# Patient Record
Sex: Male | Born: 1965 | Race: White | Hispanic: No | Marital: Married | Smoking: Never smoker
Health system: Southern US, Community
[De-identification: ages and names within clinical notes are randomized; demographics above are authoritative.]

## PROBLEM LIST (undated history)

## (undated) DIAGNOSIS — M19041 Primary osteoarthritis, right hand: Secondary | ICD-10-CM

## (undated) DIAGNOSIS — M19042 Primary osteoarthritis, left hand: Secondary | ICD-10-CM

## (undated) DIAGNOSIS — M19071 Primary osteoarthritis, right ankle and foot: Secondary | ICD-10-CM

## (undated) DIAGNOSIS — M069 Rheumatoid arthritis, unspecified: Secondary | ICD-10-CM

## (undated) DIAGNOSIS — M19072 Primary osteoarthritis, left ankle and foot: Secondary | ICD-10-CM

## (undated) HISTORY — DX: Primary osteoarthritis, left hand: M19.042

## (undated) HISTORY — DX: Rheumatoid arthritis, unspecified: M06.9

## (undated) HISTORY — DX: Primary osteoarthritis, right ankle and foot: M19.071

## (undated) HISTORY — DX: Primary osteoarthritis, right hand: M19.041

## (undated) HISTORY — DX: Primary osteoarthritis, left ankle and foot: M19.072

---

## 2007-09-22 ENCOUNTER — Encounter: Admission: RE | Admit: 2007-09-22 | Discharge: 2007-09-22 | Payer: Self-pay | Admitting: Rheumatology

## 2009-01-18 IMAGING — CR DG CHEST 2V
2 series · 2 of 2 positions shown · non-contrast
Comparison: none

CLINICAL DATA: Screening for TB.
 CHEST - TWO VIEWS:
 Two views of the chest show the lungs to be clear.  No infiltrate or effusion is seen. The heart is within normal limits in size.  No bony abnormality noted.

[view not recorded (1 of 2)]
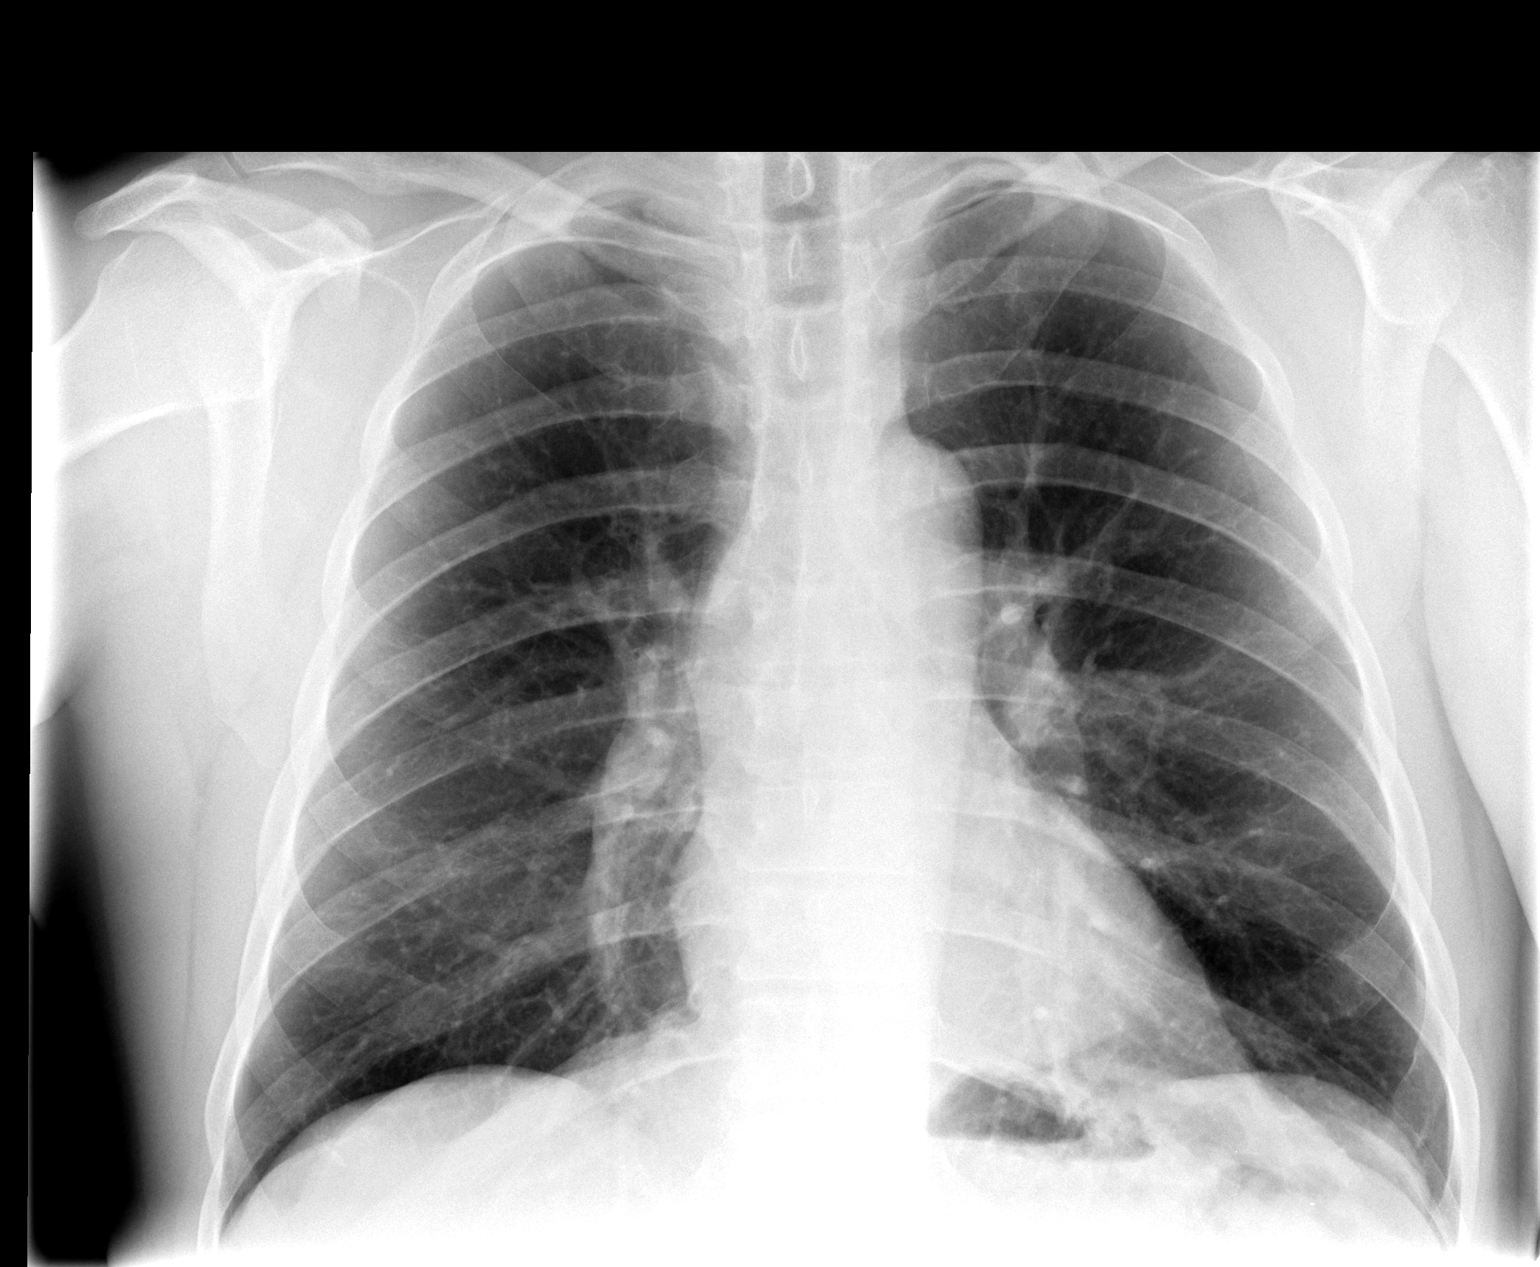

[view not recorded (2 of 2)]
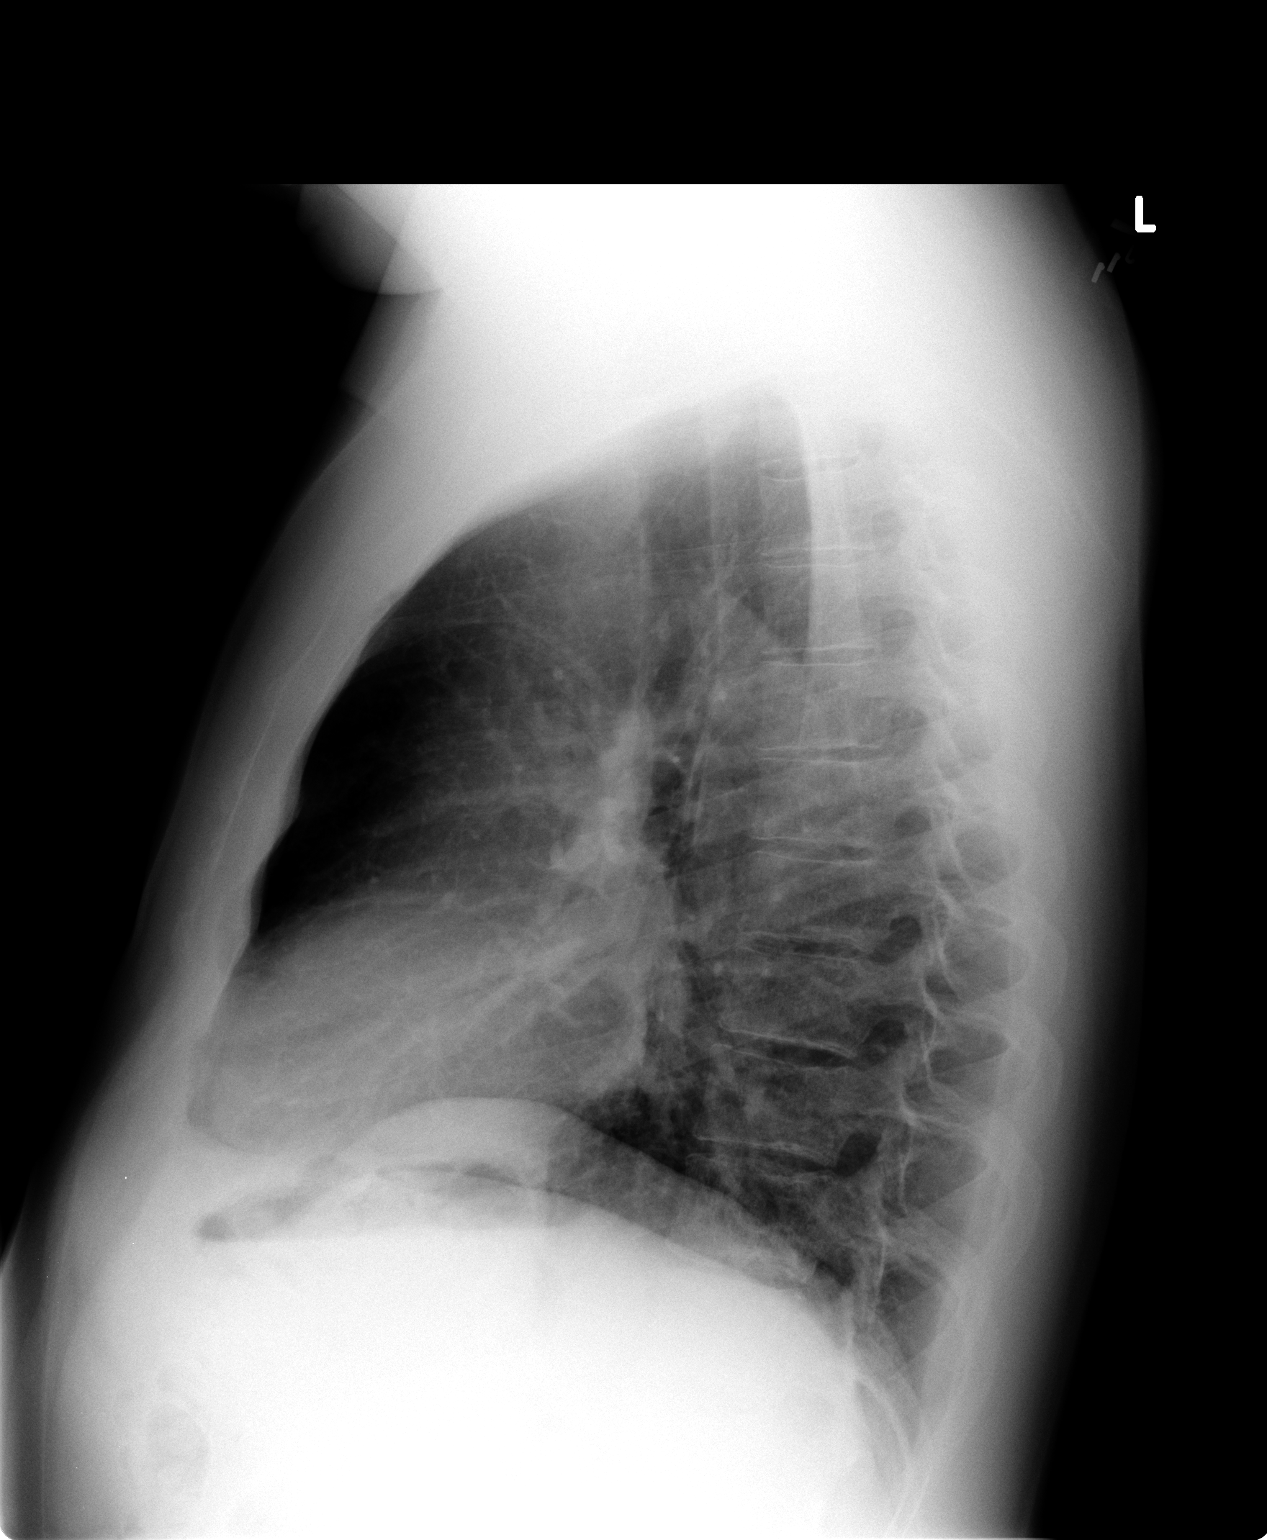

[2 of 2 positions shown; findings below may reference images not displayed]

IMPRESSION: No active lung disease.

## 2016-07-19 LAB — BASIC METABOLIC PANEL
BUN: 16 mg/dL (ref 4–21)
CREATININE: 1 mg/dL (ref 0.6–1.3)
GLUCOSE: 85 mg/dL
POTASSIUM: 4.5 mmol/L (ref 3.4–5.3)
SODIUM: 140 mmol/L (ref 137–147)

## 2016-07-19 LAB — HEPATIC FUNCTION PANEL
ALT: 35 U/L (ref 10–40)
AST: 33 U/L (ref 14–40)
Alkaline Phosphatase: 81 U/L (ref 25–125)
Bilirubin, Total: 1.2 mg/dL

## 2016-07-19 LAB — CBC AND DIFFERENTIAL
HCT: 47 % (ref 41–53)
Hemoglobin: 16.2 g/dL (ref 13.5–17.5)
Platelets: 220 10*3/uL (ref 150–399)
WBC: 5.7 10*3/mL

## 2016-09-03 ENCOUNTER — Other Ambulatory Visit: Payer: Self-pay | Admitting: Rheumatology

## 2016-09-03 NOTE — Telephone Encounter (Signed)
04/21/16 last visit 09/22/16 next visit Labs WNL 07/21/16

## 2016-09-09 ENCOUNTER — Other Ambulatory Visit: Payer: Self-pay | Admitting: Rheumatology

## 2016-09-09 NOTE — Telephone Encounter (Signed)
Last visit 04/21/16 Next visit 09/22/16 Labs 07/21/16 WNL including TB gold  Ok to refill per Dr Corliss Skains

## 2016-09-19 ENCOUNTER — Encounter: Payer: Self-pay | Admitting: *Deleted

## 2016-09-19 DIAGNOSIS — M069 Rheumatoid arthritis, unspecified: Secondary | ICD-10-CM

## 2016-09-19 DIAGNOSIS — M19041 Primary osteoarthritis, right hand: Secondary | ICD-10-CM | POA: Insufficient documentation

## 2016-09-19 DIAGNOSIS — M19071 Primary osteoarthritis, right ankle and foot: Secondary | ICD-10-CM

## 2016-09-19 DIAGNOSIS — M19042 Primary osteoarthritis, left hand: Secondary | ICD-10-CM

## 2016-09-19 DIAGNOSIS — M19072 Primary osteoarthritis, left ankle and foot: Secondary | ICD-10-CM

## 2016-09-19 HISTORY — DX: Primary osteoarthritis, left hand: M19.041

## 2016-09-19 HISTORY — DX: Primary osteoarthritis, left hand: M19.042

## 2016-09-19 HISTORY — DX: Rheumatoid arthritis, unspecified: M06.9

## 2016-09-19 HISTORY — DX: Primary osteoarthritis, right ankle and foot: M19.071

## 2016-09-19 NOTE — Progress Notes (Deleted)
   Office Visit Note  Patient: Ricky Hardin             Date of Birth: 01-22-1966           MRN: [id number]             PCP: No primary care provider on file. Referring: No ref. provider found Visit Date: 09/22/2016 Occupation: @GUAROCC @    Subjective:  No chief complaint on file.   History of Present Illness: Ricky Hardin is a 50 y.o. male ***   Activities of Daily Living:  Patient reports morning stiffness for *** {minute/hour:19697}.   Patient {ACTIONS;DENIES/REPORTS:21021675::"Denies"} nocturnal pain.  Difficulty dressing/grooming: {ACTIONS;DENIES/REPORTS:21021675::"Denies"} Difficulty climbing stairs: {ACTIONS;DENIES/REPORTS:21021675::"Denies"} Difficulty getting out of chair: {ACTIONS;DENIES/REPORTS:21021675::"Denies"} Difficulty using hands for taps, buttons, cutlery, and/or writing: {ACTIONS;DENIES/REPORTS:21021675::"Denies"}   No Rheumatology ROS completed.   PMFS History:  Patient Active Problem List   Diagnosis Date Noted  . RA (rheumatoid arthritis) (HCC) 09/19/2016  . Osteoarthritis of both hands 09/19/2016  . Osteoarthritis of both feet 09/19/2016    Past Medical History:  Diagnosis Date  . Osteoarthritis of both feet 09/19/2016  . Osteoarthritis of both hands 09/19/2016  . RA (rheumatoid arthritis) (HCC) 09/19/2016   +RF, +CCP Erosive disease    No family history on file. No past surgical history on file. Social History   Social History Narrative  . No narrative on file     Objective: Vital Signs: There were no vitals taken for this visit.   Physical Exam   Musculoskeletal Exam: ***  CDAI Exam: No CDAI exam completed.    Investigation: No additional findings.   Imaging: No results found.  Speciality Comments: No specialty comments available.    Procedures:  No procedures performed Allergies: Patient has no known allergies.   Assessment / Plan: Visit Diagnoses: No diagnosis found.    Orders: No orders of the defined  types were placed in this encounter.  No orders of the defined types were placed in this encounter.   Face-to-face time spent with patient was *** minutes. 50% of time was spent in counseling and coordination of care.  Follow-Up Instructions: No Follow-up on file.   13/08/2016, PA-C

## 2016-09-22 ENCOUNTER — Ambulatory Visit: Payer: Self-pay | Admitting: Rheumatology

## 2016-11-06 ENCOUNTER — Other Ambulatory Visit: Payer: Self-pay | Admitting: Rheumatology

## 2016-11-06 LAB — COMPLETE METABOLIC PANEL WITH GFR
ALBUMIN: 4.4 g/dL (ref 3.6–5.1)
ALK PHOS: 72 U/L (ref 40–115)
ALT: 33 U/L (ref 9–46)
AST: 29 U/L (ref 10–35)
BUN: 13 mg/dL (ref 7–25)
CO2: 27 mmol/L (ref 20–31)
Calcium: 9.4 mg/dL (ref 8.6–10.3)
Chloride: 103 mmol/L (ref 98–110)
Creat: 0.88 mg/dL (ref 0.70–1.33)
GFR, Est African American: >89 mL/min (ref >=60)
GLUCOSE: 85 mg/dL (ref 65–99)
POTASSIUM: 4.1 mmol/L (ref 3.5–5.3)
SODIUM: 138 mmol/L (ref 135–146)
Total Bilirubin: 0.9 mg/dL (ref 0.2–1.2)
Total Protein: 7.4 g/dL (ref 6.1–8.1)

## 2016-11-06 LAB — CBC WITH DIFFERENTIAL/PLATELET
BASOS ABS: 0 {cells}/uL (ref 0–200)
Basophils Relative: 0 %
EOS PCT: 2 %
Eosinophils Absolute: 142 cells/uL (ref 15–500)
HCT: 46.7 % (ref 38.5–50.0)
HEMOGLOBIN: 15.7 g/dL (ref 13.2–17.1)
LYMPHS ABS: 1988 {cells}/uL (ref 850–3900)
Lymphocytes Relative: 28 %
MCH: 30.5 pg (ref 27.0–33.0)
MCHC: 33.6 g/dL (ref 32.0–36.0)
MCV: 90.9 fL (ref 80.0–100.0)
MPV: 11.9 fL (ref 7.5–12.5)
Monocytes Absolute: 710 cells/uL (ref 200–950)
Monocytes Relative: 10 %
NEUTROS ABS: 4260 {cells}/uL (ref 1500–7800)
Neutrophils Relative %: 60 %
Platelets: 237 10*3/uL (ref 140–400)
RBC: 5.14 MIL/uL (ref 4.20–5.80)
RDW: 13.7 % (ref 11.0–15.0)
WBC: 7.1 10*3/uL (ref 3.8–10.8)

## 2016-11-06 NOTE — Telephone Encounter (Signed)
Last Visit: 04/21/16 Next Visit: 12/03/16 Labs: 07/21/16 WNL Patient is going to go tomorrow to update labs.   Okay to refill 30 supply of MTX?

## 2016-11-09 NOTE — Progress Notes (Signed)
normal

## 2016-12-03 ENCOUNTER — Ambulatory Visit (INDEPENDENT_AMBULATORY_CARE_PROVIDER_SITE_OTHER): Payer: Commercial Managed Care - PPO | Admitting: Rheumatology

## 2016-12-03 ENCOUNTER — Encounter: Payer: Self-pay | Admitting: Rheumatology

## 2016-12-03 VITALS — BP 136/80 | HR 82 | Resp 14 | Ht 70.0 in | Wt 197.0 lb

## 2016-12-03 DIAGNOSIS — M19041 Primary osteoarthritis, right hand: Secondary | ICD-10-CM | POA: Diagnosis not present

## 2016-12-03 DIAGNOSIS — Z79899 Other long term (current) drug therapy: Secondary | ICD-10-CM

## 2016-12-03 DIAGNOSIS — M19072 Primary osteoarthritis, left ankle and foot: Secondary | ICD-10-CM

## 2016-12-03 DIAGNOSIS — M0579 Rheumatoid arthritis with rheumatoid factor of multiple sites without organ or systems involvement: Secondary | ICD-10-CM

## 2016-12-03 DIAGNOSIS — M5442 Lumbago with sciatica, left side: Secondary | ICD-10-CM | POA: Diagnosis not present

## 2016-12-03 DIAGNOSIS — M19071 Primary osteoarthritis, right ankle and foot: Secondary | ICD-10-CM

## 2016-12-03 DIAGNOSIS — G8929 Other chronic pain: Secondary | ICD-10-CM

## 2016-12-03 DIAGNOSIS — M19042 Primary osteoarthritis, left hand: Secondary | ICD-10-CM

## 2016-12-03 MED ORDER — METHOTREXATE SODIUM CHEMO INJECTION 50 MG/2ML: INTRAMUSCULAR | 0 refills | Status: DC

## 2016-12-03 MED ORDER — FOLIC ACID 1 MG PO TABS: 2.0000 mg | ORAL_TABLET | Freq: Every day | ORAL | 4 refills | Status: DC

## 2016-12-03 MED ORDER — ADALIMUMAB 40 MG/0.8ML ~~LOC~~ AJKT: 1.0000 "pen " | AUTO-INJECTOR | SUBCUTANEOUS | 2 refills | Status: DC

## 2016-12-03 NOTE — Progress Notes (Signed)
Office Visit Note  Patient: Ricky Hardin             Date of Birth: [birth year]           MRN: [id number]             PCP: No PCP Per Patient Referring: No ref. provider found Visit Date: 12/03/2016 Occupation: '@GUAROCC' @    Subjective:  Follow-up Rheumatoid arthritis, [name], methotrexate, folic acid, low back pain  History of Present Illness: Ricky Hardin is a 51 y.o. male  Last seen 04/21/2016. Patient is doing well with RA.  Taking medication as prescribed.  Low back pain improved on its own.  Labs are up-to-date and normal.   Activities of Daily Living:  Patient reports morning stiffness for 15 minutes.   Patient Denies nocturnal pain.  Difficulty dressing/grooming: Denies Difficulty climbing stairs: Denies Difficulty getting out of chair: Denies Difficulty using hands for taps, buttons, cutlery, and/or writing: Denies   Review of Systems  Constitutional: Negative for fatigue.  HENT: Negative for mouth sores and mouth dryness.   Eyes: Negative for dryness.  Respiratory: Negative for shortness of breath.   Gastrointestinal: Negative for constipation and diarrhea.  Musculoskeletal: Negative for myalgias and myalgias.  Skin: Negative for sensitivity to sunlight.  Neurological: Negative for memory loss.  Psychiatric/Behavioral: Negative for sleep disturbance.    PMFS History:  Patient Active Problem List   Diagnosis Date Noted  . RA (rheumatoid arthritis) ([place]) 09/19/2016  . Osteoarthritis of both hands 09/19/2016  . Osteoarthritis of both feet 09/19/2016    Past Medical History:  Diagnosis Date  . Osteoarthritis of both feet 09/19/2016  . Osteoarthritis of both hands 09/19/2016  . RA (rheumatoid arthritis) ([place]) 09/19/2016   +RF, +CCP Erosive disease    No family history on file. No past surgical history on file. Social History   Social History Narrative  . No narrative on file     Objective: Vital Signs: BP 136/80   Pulse 82   Resp 14    Ht '5\' 10"'  (1.778 m)   Wt 197 lb (89.4 kg)   BMI 28.27 kg/m    Physical Exam  Constitutional: He is oriented to person, place, and time. He appears well-developed and well-nourished.  HENT:  Head: Normocephalic and atraumatic.  Eyes: Conjunctivae and EOM are normal. Pupils are equal, round, and reactive to light.  Neck: Normal range of motion. Neck supple.  Cardiovascular: Normal rate, regular rhythm and normal heart sounds.  Exam reveals no gallop and no friction rub.   No murmur heard. Pulmonary/Chest: Effort normal and breath sounds normal. No respiratory distress. He has no wheezes. He has no rales. He exhibits no tenderness.  Abdominal: Soft. He exhibits no distension and no mass. There is no tenderness. There is no guarding.  Musculoskeletal: Normal range of motion.  Lymphadenopathy:    He has no cervical adenopathy.  Neurological: He is alert and oriented to person, place, and time. He exhibits normal muscle tone. Coordination normal.  Skin: Skin is warm and dry. Capillary refill takes less than 2 seconds. No rash noted.  Psychiatric: He has a normal mood and affect. His behavior is normal. Judgment and thought content normal.  Nursing note and vitals reviewed.    Musculoskeletal Exam:  Full range of motion of all joints Grip strength is equal and strong bilaterally Fiber myalgia tender points are all absent  CDAI Exam: CDAI Homunculus Exam:   Joint Counts:  CDAI Tender Joint count: 0  CDAI Swollen Joint count: 0  Global Assessments:  Patient Global Assessment: 0 Provider Global Assessment: 0  CDAI Calculated Score: 0  No synovitis No joint pain swelling or stiffness History of OA of the hands with occasional very mild pain and discomfort  Investigation: No additional findings.  Orders Only on 11/06/2016  Component Date Value Ref Range Status  . Sodium 11/06/2016 138  135 - 146 mmol/L Final  . Potassium 11/06/2016 4.1  3.5 - 5.3 mmol/L Final  . Chloride  11/06/2016 103  98 - 110 mmol/L Final  . CO2 11/06/2016 27  20 - 31 mmol/L Final  . Glucose, Bld 11/06/2016 85  65 - 99 mg/dL Final  . BUN 11/06/2016 13  7 - 25 mg/dL Final  . Creat 11/06/2016 0.88  0.70 - 1.33 mg/dL Final   Comment:   For patients > or = 52 years of age: The upper reference limit for Creatinine is approximately 13% higher for people identified as African-American.     . Total Bilirubin 11/06/2016 0.9  0.2 - 1.2 mg/dL Final  . Alkaline Phosphatase 11/06/2016 72  40 - 115 U/L Final  . AST 11/06/2016 29  10 - 35 U/L Final  . ALT 11/06/2016 33  9 - 46 U/L Final  . Total Protein 11/06/2016 7.4  6.1 - 8.1 g/dL Final  . Albumin 11/06/2016 4.4  3.6 - 5.1 g/dL Final  . Calcium 11/06/2016 9.4  8.6 - 10.3 mg/dL Final  . GFR, Est African American 11/06/2016 >89  >=60 mL/min Final  . GFR, Est Non African American 11/06/2016 >89  >=60 mL/min Final  . WBC 11/06/2016 7.1  3.8 - 10.8 K/uL Final  . RBC 11/06/2016 5.14  4.20 - 5.80 MIL/uL Final  . Hemoglobin 11/06/2016 15.7  13.2 - 17.1 g/dL Final  . HCT 11/06/2016 46.7  38.5 - 50.0 % Final  . MCV 11/06/2016 90.9  80.0 - 100.0 fL Final  . MCH 11/06/2016 30.5  27.0 - 33.0 pg Final  . MCHC 11/06/2016 33.6  32.0 - 36.0 g/dL Final  . RDW 11/06/2016 13.7  11.0 - 15.0 % Final  . Platelets 11/06/2016 237  140 - 400 K/uL Final  . MPV 11/06/2016 11.9  7.5 - 12.5 fL Final  . Neutro Abs 11/06/2016 4260  1,500 - 7,800 cells/uL Final  . Lymphs Abs 11/06/2016 1988  850 - 3,900 cells/uL Final  . Monocytes Absolute 11/06/2016 710  200 - 950 cells/uL Final  . Eosinophils Absolute 11/06/2016 142  15 - 500 cells/uL Final  . Basophils Absolute 11/06/2016 0  0 - 200 cells/uL Final  . Neutrophils Relative % 11/06/2016 60  % Final  . Lymphocytes Relative 11/06/2016 28  % Final  . Monocytes Relative 11/06/2016 10  % Final  . Eosinophils Relative 11/06/2016 2  % Final  . Basophils Relative 11/06/2016 0  % Final  . Smear Review 11/06/2016 Criteria for  review not met   Final  Abstract on 09/19/2016  Component Date Value Ref Range Status  . Hemoglobin 07/19/2016 16.2  13.5 - 17.5 g/dL Final  . HCT 07/19/2016 47  41 - 53 % Final  . Platelets 07/19/2016 220  150 - 399 K/L Final  . WBC 07/19/2016 5.7  10^3/mL Final  . Glucose 07/19/2016 85  mg/dL Final  . BUN 07/19/2016 16  4 - 21 mg/dL Final  . Creatinine 07/19/2016 1.0  0.6 - 1.3 mg/dL Final  . Potassium 07/19/2016 4.5  3.4 - 5.3 mmol/L Final  .  Sodium 07/19/2016 140  137 - 147 mmol/L Final  . Alkaline Phosphatase 07/19/2016 81  25 - 125 U/L Final  . ALT 07/19/2016 35  10 - 40 U/L Final  . AST 07/19/2016 33  14 - 40 U/L Final  . Bilirubin, Total 07/19/2016 1.2  mg/dL Final     Imaging: No results found.  Speciality Comments: No specialty comments available.    Procedures:  No procedures performed Allergies: Patient has no known allergies.   Assessment / Plan:     Visit Diagnoses: Rheumatoid arthritis involving multiple sites with positive rheumatoid factor (HCC)  High risk medications (not anticoagulants) long-term use - 11/02/2017: [name] every 2 weeksAnd methotrexate 0.7 mL's every ThursdayFolic acid 2 mg every day  Primary osteoarthritis of both hands  Chronic midline low back pain with left-sided sciatica  Primary osteoarthritis of both feet    Plan: #1: Doing well with rheumatoid arthritis. No joint pain stiffness and swelling. History of positive rheumatoid factor, positive CCP. History of erosive disease.  #2: High risk prescription. [facility] Every 2 weeks. On Friday (note: Patient states that he starts feeling a little bit of discomfort in his hands a day before he is due for his next [name] shot.) Methotrexate 0.7 mL's every Thursday. Folic acid 2 mg every day.  #3: OA of the hands. Occasional mild discomfort. We discussed OA supplements  #4: History of low back pain. Patient got better. Did not go to physical therapy It went away on its own. No pain  at this time.  Per 5: CBC with differential and CMP with GFR up-to-date and normal. And due every 3 months TB goal will be due July 2018.  #6: Refill [name] every 2 weeks, methotrexate or folic acid  #7: Annual dermatology check  Orders: No orders of the defined types were placed in this encounter.  Meds ordered this encounter  Medications  . Adalimumab ([name] PEN) 40 MG/0.8ML PNKT    Sig: Inject 1 pen into the skin every 14 (fourteen) days.    Dispense:  1 each    Refill:  2  . methotrexate 50 MG/2ML injection    Sig: INJECT 0.7 MILLILITERS ONCE A WEEK    Dispense:  10 mL    Refill:  0  . folic acid (FOLVITE) 1 MG tablet    Sig: Take 2 tablets (2 mg total) by mouth daily.    Dispense:  90 tablet    Refill:  4    Order Specific Question:   Supervising Provider    Answer:   Bo Merino [phone]    Face-to-face time spent with patient was 30 minutes. 50% of time was spent in counseling and coordination of care.  Follow-Up Instructions: Return in about 5 months (around 05/03/2017) for RA,[name],MTX0.74m,folic,lbp w/ left radicul.   NEliezer Lofts PA-C  Note - This record has been created using [employer]  Chart creation errors have been sought, but may not always  have been located. Such creation errors do not reflect on  the standard of medical care.

## 2016-12-03 NOTE — Patient Instructions (Signed)
Supplements for OA Natural anti-inflammatories  You can purchase these at [employer], [employer] or online.  . Turmeric (capsules)  . Ginger (ginger root or capsules)  . Omega 3 (Fish, flax seeds, chia seeds, walnuts, almonds)  . Tart cherry (dried or extract)   Patient should be under the care of a physician while taking these supplements. This may not be reproduced without the permission of Dr. Shaili Deveshwar.  

## 2017-01-22 ENCOUNTER — Other Ambulatory Visit: Payer: Self-pay | Admitting: Rheumatology

## 2017-01-22 NOTE — Telephone Encounter (Signed)
Last Visit: 12/03/16 Next Visit: 05/05/17 Labs: 11/06/16 WNL  Left message for patient to advise he is due to update labs.   Okay to refill MTX?

## 2017-01-23 ENCOUNTER — Telehealth (INDEPENDENT_AMBULATORY_CARE_PROVIDER_SITE_OTHER): Payer: Self-pay

## 2017-01-23 ENCOUNTER — Other Ambulatory Visit: Payer: Self-pay | Admitting: *Deleted

## 2017-01-23 MED ORDER — METHOTREXATE SODIUM CHEMO INJECTION 50 MG/2ML: INTRAMUSCULAR | 0 refills | Status: DC

## 2017-01-23 NOTE — Telephone Encounter (Signed)
[facility] calling stating they can't get the prescribed dose of the methotrexate, only "preservative free". Please call them back about this.

## 2017-01-23 NOTE — Telephone Encounter (Addendum)
Prescription sent to [facility] where they have the MTX with preservative in stock. Patient was agreeable and will pick up his prescription . [facility] advised to cancel prescription.

## 2017-02-16 ENCOUNTER — Other Ambulatory Visit: Payer: Self-pay | Admitting: Rheumatology

## 2017-02-17 NOTE — Telephone Encounter (Deleted)
Last Visit: 12/03/16 Next Visit: 05/05/17 Labs: 11/06/16 WNL Left message to advise patient he is due for labs.

## 2017-02-19 ENCOUNTER — Telehealth: Payer: Self-pay | Admitting: Rheumatology

## 2017-02-19 ENCOUNTER — Other Ambulatory Visit: Payer: Self-pay | Admitting: *Deleted

## 2017-02-19 DIAGNOSIS — Z79899 Other long term (current) drug therapy: Secondary | ICD-10-CM

## 2017-02-19 NOTE — Telephone Encounter (Signed)
Patient is requesting lab orders be released for [facility] on [address]. He will be going tomorrow morning for labs.

## 2017-02-19 NOTE — Telephone Encounter (Signed)
Labs released and faxed  

## 2017-02-20 ENCOUNTER — Other Ambulatory Visit: Payer: Self-pay | Admitting: Rheumatology

## 2017-02-20 LAB — CBC WITH DIFFERENTIAL/PLATELET
BASOS ABS: 56 {cells}/uL (ref 0–200)
BASOS PCT: 1 %
EOS PCT: 4 %
Eosinophils Absolute: 224 cells/uL (ref 15–500)
HCT: 46 % (ref 38.5–50.0)
HEMOGLOBIN: 15.2 g/dL (ref 13.2–17.1)
LYMPHS ABS: 1400 {cells}/uL (ref 850–3900)
Lymphocytes Relative: 25 %
MCH: 30.1 pg (ref 27.0–33.0)
MCHC: 33 g/dL (ref 32.0–36.0)
MCV: 91.1 fL (ref 80.0–100.0)
MPV: 11.7 fL (ref 7.5–12.5)
Monocytes Absolute: 504 cells/uL (ref 200–950)
Monocytes Relative: 9 %
NEUTROS ABS: 3416 {cells}/uL (ref 1500–7800)
Neutrophils Relative %: 61 %
PLATELETS: 245 10*3/uL (ref 140–400)
RBC: 5.05 MIL/uL (ref 4.20–5.80)
RDW: 14 % (ref 11.0–15.0)
WBC: 5.6 10*3/uL (ref 3.8–10.8)

## 2017-02-20 LAB — COMPLETE METABOLIC PANEL WITH GFR
ALBUMIN: 4 g/dL (ref 3.6–5.1)
ALK PHOS: 73 U/L (ref 40–115)
ALT: 31 U/L (ref 9–46)
AST: 25 U/L (ref 10–35)
BUN: 17 mg/dL (ref 7–25)
CO2: 24 mmol/L (ref 20–31)
Calcium: 9.5 mg/dL (ref 8.6–10.3)
Chloride: 104 mmol/L (ref 98–110)
Creat: 1.1 mg/dL (ref 0.70–1.33)
GFR, EST NON AFRICAN AMERICAN: 77 mL/min (ref >=60)
GFR, Est African American: 89 mL/min (ref >=60)
GLUCOSE: 85 mg/dL (ref 65–99)
POTASSIUM: 4.1 mmol/L (ref 3.5–5.3)
SODIUM: 141 mmol/L (ref 135–146)
Total Bilirubin: 0.9 mg/dL (ref 0.2–1.2)
Total Protein: 7.1 g/dL (ref 6.1–8.1)

## 2017-02-20 NOTE — Progress Notes (Signed)
WNL

## 2017-02-27 ENCOUNTER — Other Ambulatory Visit: Payer: Self-pay | Admitting: Rheumatology

## 2017-03-10 ENCOUNTER — Other Ambulatory Visit: Payer: Self-pay | Admitting: Radiology

## 2017-03-10 MED ORDER — METHOTREXATE SODIUM CHEMO INJECTION 50 MG/2ML: INTRAMUSCULAR | 0 refills | Status: DC

## 2017-03-10 NOTE — Telephone Encounter (Signed)
12/03/16 last visit  05/05/17 next visit  Labs WNL 02/20/17 Ok to refill per Dr Corliss Skains

## 2017-03-11 ENCOUNTER — Other Ambulatory Visit: Payer: Self-pay | Admitting: Rheumatology

## 2017-03-24 MED FILL — METHOTREXATE 25 MG/ML VIAL: 50 | 28 days supply | Qty: 4 | Fill #0

## 2017-04-01 ENCOUNTER — Other Ambulatory Visit: Payer: Self-pay | Admitting: Rheumatology

## 2017-04-02 NOTE — Telephone Encounter (Signed)
last visit 12/03/16   next visit 05/05/17 Labs WNL 02/20/17 TB Gold: 05/2016 Neg  Okay to refill [name]?

## 2017-05-01 NOTE — Progress Notes (Signed)
Office Visit Note  Patient: Ricky Hardin             Date of Birth: [birth year]           MRN: [id number]             PCP: Patient, No Pcp Per Referring: No ref. provider found Visit Date: 05/05/2017 Occupation: _0 @    Subjective:  Medication management   History of Present Illness: Ricky Hardin is a 51 y.o. male  Who was last seen 12/03/2016 for rheumatoid arthritis with history of positive rheumatoid factor, positive CCP, history of erosive disease. He states he is doing quite well on combination therapy of methotrexate and [name]. He denies any joint swelling or discomfort recently. His been tolerating his medications well.     Activities of Daily Living:  Patient reports morning stiffness for 0 minute.   Patient Denies nocturnal pain.  Difficulty dressing/grooming: Denies Difficulty climbing stairs: Denies Difficulty getting out of chair: Denies Difficulty using hands for taps, buttons, cutlery, and/or writing: Denies   Review of Systems  Constitutional: Negative for fatigue, night sweats and weakness ( ).  HENT: Negative for mouth sores, mouth dryness and nose dryness.   Eyes: Positive for dryness. Negative for redness.  Respiratory: Negative for shortness of breath and difficulty breathing.   Cardiovascular: Negative for chest pain, palpitations, hypertension, irregular heartbeat and swelling in legs/feet.  Gastrointestinal: Negative for constipation and diarrhea.  Endocrine: Negative for increased urination.  Musculoskeletal: Negative for arthralgias, joint pain, joint swelling, myalgias, muscle weakness, morning stiffness, muscle tenderness and myalgias.  Skin: Negative for color change, rash, hair loss, nodules/bumps, skin tightness, ulcers and sensitivity to sunlight.  Allergic/Immunologic: Negative for susceptible to infections.  Neurological: Negative for dizziness, fainting, memory loss and night sweats.  Hematological: Negative for swollen glands.    Psychiatric/Behavioral: Negative for depressed mood and sleep disturbance. The patient is not nervous/anxious.     PMFS History:  Patient Active Problem List   Diagnosis Date Noted  . RA (rheumatoid arthritis) ([place]) 09/19/2016  . Osteoarthritis of both hands 09/19/2016  . Osteoarthritis of both feet 09/19/2016    Past Medical History:  Diagnosis Date  . Osteoarthritis of both feet 09/19/2016  . Osteoarthritis of both hands 09/19/2016  . RA (rheumatoid arthritis) ([place]) 09/19/2016   +RF, +CCP Erosive disease    History reviewed. No pertinent family history. History reviewed. No pertinent surgical history. Social History   Social History Narrative  . No narrative on file     Objective: Vital Signs: BP 116/78   Pulse (!) 58   Resp 14   Ht _1  (1.778 m)   Wt 195 lb (88.5 kg)   BMI 27.98 kg/m    Physical Exam  Constitutional: He is oriented to person, place, and time. He appears well-developed and well-nourished.  HENT:  Head: Normocephalic and atraumatic.  Eyes: Conjunctivae and EOM are normal. Pupils are equal, round, and reactive to light.  Neck: Normal range of motion. Neck supple.  Cardiovascular: Normal rate, regular rhythm and normal heart sounds.  Exam reveals no gallop and no friction rub.   No murmur heard. Pulmonary/Chest: Effort normal and breath sounds normal. No respiratory distress. He has no wheezes. He has no rales. He exhibits no tenderness.  Abdominal: Soft. Bowel sounds are normal. He exhibits no distension and no mass. There is no tenderness. There is no guarding.  Musculoskeletal: Normal range of motion.  Lymphadenopathy:    He has no cervical  adenopathy.  Neurological: He is alert and oriented to person, place, and time. He exhibits normal muscle tone. Coordination normal.  Skin: Skin is warm and dry. Capillary refill takes less than 2 seconds. No rash noted.  Psychiatric: He has a normal mood and affect. His behavior is normal. Judgment and  thought content normal.  Nursing note and vitals reviewed.    Musculoskeletal Exam: C-spine and thoracic lumbar spine good range of motion. Shoulder joints elbow joints wrist joints are good range of motion. He has some DIP PIP thickening but no synovitis was noted over her MCP joints. Hip joints knee joints ankles MTPs PIPs DIPs with good range of motion with no synovitis.  CDAI Exam: CDAI Homunculus Exam:   Joint Counts:  CDAI Tender Joint count: 0 CDAI Swollen Joint count: 0  Global Assessments:  Patient Global Assessment: 1 Provider Global Assessment: 1  CDAI Calculated Score: 2    Investigation: No additional findings. Orders Only on 02/20/2017  Component Date Value Ref Range Status  . Sodium 02/20/2017 141  135 - 146 mmol/L Final  . Potassium 02/20/2017 4.1  3.5 - 5.3 mmol/L Final  . Chloride 02/20/2017 104  98 - 110 mmol/L Final  . CO2 02/20/2017 24  20 - 31 mmol/L Final  . Glucose, Bld 02/20/2017 85  65 - 99 mg/dL Final  . BUN 02/20/2017 17  7 - 25 mg/dL Final  . Creat 02/20/2017 1.10  0.70 - 1.33 mg/dL Final   Comment:   For patients > or = 52 years of age: The upper reference limit for Creatinine is approximately 13% higher for people identified as African-American.     . Total Bilirubin 02/20/2017 0.9  0.2 - 1.2 mg/dL Final  . Alkaline Phosphatase 02/20/2017 73  40 - 115 U/L Final  . AST 02/20/2017 25  10 - 35 U/L Final  . ALT 02/20/2017 31  9 - 46 U/L Final  . Total Protein 02/20/2017 7.1  6.1 - 8.1 g/dL Final  . Albumin 02/20/2017 4.0  3.6 - 5.1 g/dL Final  . Calcium 02/20/2017 9.5  8.6 - 10.3 mg/dL Final  . GFR, Est African American 02/20/2017 89  >=60 mL/min Final  . GFR, Est Non African American 02/20/2017 77  >=60 mL/min Final  . WBC 02/20/2017 5.6  3.8 - 10.8 K/uL Final  . RBC 02/20/2017 5.05  4.20 - 5.80 MIL/uL Final  . Hemoglobin 02/20/2017 15.2  13.2 - 17.1 g/dL Final  . HCT 02/20/2017 46.0  38.5 - 50.0 % Final  . MCV 02/20/2017 91.1  80.0 -  100.0 fL Final  . MCH 02/20/2017 30.1  27.0 - 33.0 pg Final  . MCHC 02/20/2017 33.0  32.0 - 36.0 g/dL Final  . RDW 02/20/2017 14.0  11.0 - 15.0 % Final  . Platelets 02/20/2017 245  140 - 400 K/uL Final  . MPV 02/20/2017 11.7  7.5 - 12.5 fL Final  . Neutro Abs 02/20/2017 3416  1,500 - 7,800 cells/uL Final  . Lymphs Abs 02/20/2017 1400  850 - 3,900 cells/uL Final  . Monocytes Absolute 02/20/2017 504  200 - 950 cells/uL Final  . Eosinophils Absolute 02/20/2017 224  15 - 500 cells/uL Final  . Basophils Absolute 02/20/2017 56  0 - 200 cells/uL Final  . Neutrophils Relative % 02/20/2017 61  % Final  . Lymphocytes Relative 02/20/2017 25  % Final  . Monocytes Relative 02/20/2017 9  % Final  . Eosinophils Relative 02/20/2017 4  % Final  . Basophils  Relative 02/20/2017 1  % Final  . Smear Review 02/20/2017 Criteria for review not met   Final  Orders Only on 11/06/2016  Component Date Value Ref Range Status  . Sodium 11/06/2016 138  135 - 146 mmol/L Final  . Potassium 11/06/2016 4.1  3.5 - 5.3 mmol/L Final  . Chloride 11/06/2016 103  98 - 110 mmol/L Final  . CO2 11/06/2016 27  20 - 31 mmol/L Final  . Glucose, Bld 11/06/2016 85  65 - 99 mg/dL Final  . BUN 11/06/2016 13  7 - 25 mg/dL Final  . Creat 11/06/2016 0.88  0.70 - 1.33 mg/dL Final   Comment:   For patients > or = 52 years of age: The upper reference limit for Creatinine is approximately 13% higher for people identified as African-American.     . Total Bilirubin 11/06/2016 0.9  0.2 - 1.2 mg/dL Final  . Alkaline Phosphatase 11/06/2016 72  40 - 115 U/L Final  . AST 11/06/2016 29  10 - 35 U/L Final  . ALT 11/06/2016 33  9 - 46 U/L Final  . Total Protein 11/06/2016 7.4  6.1 - 8.1 g/dL Final  . Albumin 11/06/2016 4.4  3.6 - 5.1 g/dL Final  . Calcium 11/06/2016 9.4  8.6 - 10.3 mg/dL Final  . GFR, Est African American 11/06/2016 >89  >=60 mL/min Final  . GFR, Est Non African American 11/06/2016 >89  >=60 mL/min Final  . WBC 11/06/2016 7.1   3.8 - 10.8 K/uL Final  . RBC 11/06/2016 5.14  4.20 - 5.80 MIL/uL Final  . Hemoglobin 11/06/2016 15.7  13.2 - 17.1 g/dL Final  . HCT 11/06/2016 46.7  38.5 - 50.0 % Final  . MCV 11/06/2016 90.9  80.0 - 100.0 fL Final  . MCH 11/06/2016 30.5  27.0 - 33.0 pg Final  . MCHC 11/06/2016 33.6  32.0 - 36.0 g/dL Final  . RDW 11/06/2016 13.7  11.0 - 15.0 % Final  . Platelets 11/06/2016 237  140 - 400 K/uL Final  . MPV 11/06/2016 11.9  7.5 - 12.5 fL Final  . Neutro Abs 11/06/2016 4260  1,500 - 7,800 cells/uL Final  . Lymphs Abs 11/06/2016 1988  850 - 3,900 cells/uL Final  . Monocytes Absolute 11/06/2016 710  200 - 950 cells/uL Final  . Eosinophils Absolute 11/06/2016 142  15 - 500 cells/uL Final  . Basophils Absolute 11/06/2016 0  0 - 200 cells/uL Final  . Neutrophils Relative % 11/06/2016 60  % Final  . Lymphocytes Relative 11/06/2016 28  % Final  . Monocytes Relative 11/06/2016 10  % Final  . Eosinophils Relative 11/06/2016 2  % Final  . Basophils Relative 11/06/2016 0  % Final  . Smear Review 11/06/2016 Criteria for review not met   Final     Imaging: No results found.  Speciality Comments: No specialty comments available.    Procedures:  No procedures performed Allergies: Patient has no known allergies.   Assessment / Plan:     Visit Diagnoses: Rheumatoid arthritis involving multiple sites with positive rheumatoid factor ([place]): Patient has positive rheumatoid factor, positive CCP and history of erosive disease. History really well on current combination of medication. He denies any morning stiffness joint pain or joint swelling.  High risk medications (not anticoagulants) long-term use: He is on methotrexate 0.7 mL subcutaneous every week, folic acid 2 mg by mouth daily, [name] 40 mg subcutaneous every other week. We will check his labs today along with TB gold and then every  3 months to monitor for drug toxicity.  Primary osteoarthritis of both hands: Joint protection and muscle  strengthening discussed.  Primary osteoarthritis of both feet : His been flaring proper fitting shoes.  Family history of heart disease in father and brother: Association of heart disease with rheumatoid arthritis was discussed. Need to monitor blood pressure, cholesterol, and to exercise 30-60 minutes on daily basis was discussed. Poor dental hygiene can be a predisposing factor for rheumatoid arthritis. Good dental hygiene was discussed.  Orders: Orders Placed This Encounter  Procedures  . CBC with Differential/Platelet  . COMPLETE METABOLIC PANEL WITH GFR  . Quantiferon tb gold assay (blood)   No orders of the defined types were placed in this encounter.   Face-to-face time spent with patient was 30 minutes. 50% of time was spent in counseling and coordination of care.  Follow-Up Instructions: Return in about 5 months (around 10/05/2017) for Rheumatoid arthritis.   Bo Merino, MD  Note - This record has been created using Editor, [employer].  Chart creation errors have been sought, but may not always  have been located. Such creation errors do not reflect on  the standard of medical care.

## 2017-05-05 ENCOUNTER — Encounter: Payer: Self-pay | Admitting: Rheumatology

## 2017-05-05 ENCOUNTER — Ambulatory Visit (INDEPENDENT_AMBULATORY_CARE_PROVIDER_SITE_OTHER): Payer: Commercial Managed Care - PPO | Admitting: Rheumatology

## 2017-05-05 VITALS — BP 116/78 | HR 58 | Resp 14 | Ht 70.0 in | Wt 195.0 lb

## 2017-05-05 DIAGNOSIS — Z79899 Other long term (current) drug therapy: Secondary | ICD-10-CM | POA: Diagnosis not present

## 2017-05-05 DIAGNOSIS — M0579 Rheumatoid arthritis with rheumatoid factor of multiple sites without organ or systems involvement: Secondary | ICD-10-CM

## 2017-05-05 DIAGNOSIS — M19071 Primary osteoarthritis, right ankle and foot: Secondary | ICD-10-CM

## 2017-05-05 DIAGNOSIS — M19041 Primary osteoarthritis, right hand: Secondary | ICD-10-CM

## 2017-05-05 DIAGNOSIS — M19072 Primary osteoarthritis, left ankle and foot: Secondary | ICD-10-CM

## 2017-05-05 DIAGNOSIS — M19042 Primary osteoarthritis, left hand: Secondary | ICD-10-CM

## 2017-05-05 LAB — CBC WITH DIFFERENTIAL/PLATELET
BASOS ABS: 77 {cells}/uL (ref 0–200)
Basophils Relative: 1 %
EOS ABS: 231 {cells}/uL (ref 15–500)
EOS PCT: 3 %
HEMATOCRIT: 47.2 % (ref 38.5–50.0)
HEMOGLOBIN: 15.5 g/dL (ref 13.2–17.1)
LYMPHS ABS: 1925 {cells}/uL (ref 850–3900)
Lymphocytes Relative: 25 %
MCH: 29.5 pg (ref 27.0–33.0)
MCHC: 32.8 g/dL (ref 32.0–36.0)
MCV: 89.9 fL (ref 80.0–100.0)
MPV: 12.1 fL (ref 7.5–12.5)
Monocytes Absolute: 693 cells/uL (ref 200–950)
Monocytes Relative: 9 %
NEUTROS PCT: 62 %
Neutro Abs: 4774 cells/uL (ref 1500–7800)
PLATELETS: 246 10*3/uL (ref 140–400)
RBC: 5.25 MIL/uL (ref 4.20–5.80)
RDW: 14 % (ref 11.0–15.0)
WBC: 7.7 10*3/uL (ref 3.8–10.8)

## 2017-05-05 NOTE — Patient Instructions (Signed)
Standing Labs We placed an order today for your standing lab work.    Please come back and get your standing labs in September and every 3 months  We have open lab Monday through Friday from 8:30-11:30 AM and 1:30-4 PM at the office of Dr. Arbutus Ped, PA.   The office is located at [address], [place], [place] [address] No appointment is necessary.   Labs are drawn by [facility].  You may receive a bill from [facility] for your lab work. If you have any questions regarding directions or hours of operation,  please call [phone].    Association of heart disease with rheumatoid arthritis was discussed. Need to monitor blood pressure, cholesterol, and to exercise 30-60 minutes on daily basis was discussed. Poor dental hygiene can be a predisposing factor for rheumatoid arthritis. Good dental hygiene was discussed.

## 2017-05-06 LAB — COMPLETE METABOLIC PANEL WITH GFR
ALT: 27 U/L (ref 9–46)
AST: 25 U/L (ref 10–35)
Albumin: 4.3 g/dL (ref 3.6–5.1)
Alkaline Phosphatase: 83 U/L (ref 40–115)
BUN: 16 mg/dL (ref 7–25)
CHLORIDE: 102 mmol/L (ref 98–110)
CO2: 24 mmol/L (ref 20–31)
CREATININE: 0.84 mg/dL (ref 0.70–1.33)
Calcium: 9.4 mg/dL (ref 8.6–10.3)
GFR, Est African American: >89 mL/min (ref >=60)
GFR, Est Non African American: >89 mL/min (ref >=60)
GLUCOSE: 78 mg/dL (ref 65–99)
Potassium: 4.6 mmol/L (ref 3.5–5.3)
Sodium: 137 mmol/L (ref 135–146)
TOTAL PROTEIN: 7.6 g/dL (ref 6.1–8.1)
Total Bilirubin: 0.9 mg/dL (ref 0.2–1.2)

## 2017-05-07 LAB — QUANTIFERON TB GOLD ASSAY (BLOOD)
Interferon Gamma Release Assay: NEGATIVE
Mitogen-Nil: 8.4 IU/mL
QUANTIFERON NIL VALUE: 0.03 [IU]/mL
Quantiferon Tb Ag Minus Nil Value: 0 IU/mL

## 2017-05-07 NOTE — Progress Notes (Signed)
WNL

## 2017-05-19 MED FILL — METHOTREXATE 25 MG/ML VIAL: 50 | 28 days supply | Qty: 4 | Fill #1

## 2017-07-08 ENCOUNTER — Other Ambulatory Visit: Payer: Self-pay | Admitting: Rheumatology

## 2017-07-08 NOTE — Telephone Encounter (Signed)
Last Visit: 05/05/17 Next Visit: 10/08/17 Labs: 05/05/17 WNL  Okay to refill per Dr. Corliss Skains

## 2017-07-30 ENCOUNTER — Other Ambulatory Visit: Payer: Self-pay | Admitting: Rheumatology

## 2017-07-30 NOTE — Telephone Encounter (Signed)
Last Visit: 05/05/17 Next Visit: 10/08/17 Labs: 05/05/17 WNL TB Gold: 05/05/17 Neg  Okay to refill per Dr. Corliss Skains

## 2017-09-29 NOTE — Progress Notes (Signed)
Office Visit Note  Patient: Ricky Hardin             Date of Birth: 01/04/1966           MRN: [id number]             PCP: Patient, No Pcp Per Referring: No ref. provider found Visit Date: 10/08/2017 Occupation: @GUAROCC @    Subjective:  Medication management    History of Present Illness: Ricky Hardin is a 51 y.o. male with a history of seropositive rheumatoid arthritis and osteoarthritis.  Patient states he has had no recent flares.  He feels very well controlled on [name] and Methotrexate.  He denies any joint pain or swelling.  He states has very mild morning stiffness for a couple minutes first thing in the morning, but this has improved significantly.  He reports good grip strength.      Activities of Daily Living:  Patient reports morning stiffness for2  minutes.   Patient Denies nocturnal pain.  Difficulty dressing/grooming: Denies Difficulty climbing stairs: Denies Difficulty getting out of chair: Denies Difficulty using hands for taps, buttons, cutlery, and/or writing: Denies   Review of Systems  Constitutional: Negative for fatigue.  HENT: Negative for mouth sores, mouth dryness and nose dryness.   Eyes: Positive for dryness. Negative for redness.  Respiratory: Negative for cough, shortness of breath and difficulty breathing.   Cardiovascular: Negative for chest pain, palpitations, hypertension, irregular heartbeat and swelling in legs/feet.  Gastrointestinal: Negative for blood in stool, constipation and diarrhea.  Endocrine: Negative for increased urination.  Genitourinary: Negative for painful urination.  Musculoskeletal: Negative for arthralgias, joint pain, joint swelling, myalgias, muscle weakness, morning stiffness and myalgias.  Skin: Negative for color change, rash, nodules/bumps, redness and skin tightness.  Neurological: Positive for headaches.  Hematological: Negative for swollen glands.  Psychiatric/Behavioral: Negative for depressed mood and sleep  disturbance. The patient is not nervous/anxious.     PMFS History:  Patient Active Problem List   Diagnosis Date Noted  . RA (rheumatoid arthritis) ([facility]) 09/19/2016  . Osteoarthritis of both hands 09/19/2016  . Osteoarthritis of both feet 09/19/2016    Past Medical History:  Diagnosis Date  . Osteoarthritis of both feet 09/19/2016  . Osteoarthritis of both hands 09/19/2016  . RA (rheumatoid arthritis) ([facility]) 09/19/2016   +RF, +CCP Erosive disease    Family History  Problem Relation Age of Onset  . Heart disease Father 72  . Rheum arthritis Sister    History reviewed. No pertinent surgical history. Social History   Social History Narrative  . Not on file     Objective: Vital Signs: BP 116/74 (BP Location: Left Arm, Patient Position: Sitting, Cuff Size: Normal)   Pulse 76   Resp 17   Ht 5\' 10"  (1.778 m)   Wt 194 lb (88 kg)   BMI 27.84 kg/m    Physical Exam  Constitutional: He appears well-developed and well-nourished.  HENT:  Head: Normocephalic.  Eyes: Conjunctivae and EOM are normal.  Neck: Normal range of motion.  Cardiovascular: Normal rate, regular rhythm and normal heart sounds.  Pulmonary/Chest: Effort normal and breath sounds normal.  Abdominal: Soft. Bowel sounds are normal.  Musculoskeletal: Normal range of motion. He exhibits no tenderness.  Lymphadenopathy:    He has no cervical adenopathy.  Neurological: He is alert.  Skin: Skin is warm. Capillary refill takes less than 2 seconds.  Psychiatric: He has a normal mood and affect. His behavior is normal.  Vitals reviewed.  Musculoskeletal Exam: C-spine, thoracic, and lumbar spine good ROM with no discomfort.  Shoulder joints, elbow joints, wrist joints, MCPs, PIPs, and DIPs good ROM with no synovitis.  DIP synovial thickening bilaterally consistent with osteoarthritic changes.  Hip joints, knee joints, and ankle joints good ROM with no synovitis.  MTPs, PIPs, and DIPs good ROM with no synovitis.      CDAI Exam: CDAI Homunculus Exam:   Joint Counts:  CDAI Tender Joint count: 0 CDAI Swollen Joint count: 0  Global Assessments:  Patient Global Assessment: 1 Provider Global Assessment: 1  CDAI Calculated Score: 2    Investigation: No additional findings.Tb Gold: 05/05/2017 Negative  CBC Latest Ref Rng & Units 05/05/2017 02/20/2017 11/06/2016  WBC 3.8 - 10.8 K/uL 7.7 5.6 7.1  Hemoglobin 13.2 - 17.1 g/dL 16.1 09.6 04.5  Hematocrit 38.5 - 50.0 % 47.2 46.0 46.7  Platelets 140 - 400 K/uL 246 245 237   CMP Latest Ref Rng & Units 05/05/2017 02/20/2017 11/06/2016  Glucose 65 - 99 mg/dL 78 85 85  BUN 7 - 25 mg/dL 16 17 13   Creatinine 0.70 - 1.33 mg/dL 4.09 8.11  Sodium 135 - 146 mmol/L 137 141 138  Potassium 3.5 - 5.3 mmol/L 4.6 4.1 4.1  Chloride 98 - 110 mmol/L 102 104 103  CO2 20 - 31 mmol/L 24 24 27   Calcium 8.6 - 10.3 mg/dL 9.4 9.5 9.4  Total Protein 6.1 - 8.1 g/dL 7.6 7.1 7.4  Total Bilirubin 0.2 - 1.2 mg/dL 0.9 0.9 0.9  Alkaline Phos 40 - 115 U/L 83 73 72  AST 10 - 35 U/L 25 25 29   ALT 9 - 46 U/L 27 31 33    Imaging: No results found.  Speciality Comments: No specialty comments available.    Procedures:  No procedures performed Allergies: Patient has no known allergies.   Assessment / Plan:     Visit Diagnoses: Rheumatoid arthritis involving multiple sites with positive rheumatoid factor ([facility]) - Patient has positive rheumatoid factor, positive CCP and history of erosive disease.  Patient is clinically stable with no synovitis on exam.  Patient is doing great with no recent flares on Methotrexate and [name].  Patient was informed that he can start spacing out his [name] injections to every 3 weeks as long as he continues to have no flares.    High risk medication use - methotrexate 0.7 mL subcutaneous every week, folic acid 2 mg by mouth daily, [name] 40 mg subcutaneous every 3 weeks.   Patient will have routine CBC and CMP labs drawn today.  Reminded patient of  the risks and benefits of [name].  Discussed long-term effects and risk of infection.  Encouraged hand-washing and reminded him to hold his dose of [name] if he gets an infection.  He is going to start spacing out his [name] to every 3 weeks.       Primary osteoarthritis of both hands: No pain or swelling of bilateral hands. Long-standing synovial thickening of DIP joints consistent with osteoarthritic changes.    Primary osteoarthritis of both feet: No synovitis or pain at this time.    Family history of heart disease - in father and brother    Orders: Orders Placed This Encounter  Procedures  . CBC with Differential/Platelet  . COMPLETE METABOLIC PANEL WITH GFR  . CBC with Differential/Platelet  . COMPLETE METABOLIC PANEL WITH GFR   No orders of the defined types were placed in this encounter.    Follow-Up Instructions: Return in  about 5 months (around 03/08/2018).    Note - This record has been created using [employer].  Chart creation errors have been sought, but may not always  have been located. Such creation errors do not reflect on  the standard of medical care.

## 2017-10-08 ENCOUNTER — Ambulatory Visit (INDEPENDENT_AMBULATORY_CARE_PROVIDER_SITE_OTHER): Payer: Commercial Managed Care - PPO | Admitting: Rheumatology

## 2017-10-08 ENCOUNTER — Encounter: Payer: Self-pay | Admitting: Rheumatology

## 2017-10-08 VITALS — BP 116/74 | HR 76 | Resp 17 | Ht 70.0 in | Wt 194.0 lb

## 2017-10-08 DIAGNOSIS — M0579 Rheumatoid arthritis with rheumatoid factor of multiple sites without organ or systems involvement: Secondary | ICD-10-CM | POA: Diagnosis not present

## 2017-10-08 DIAGNOSIS — M19042 Primary osteoarthritis, left hand: Secondary | ICD-10-CM

## 2017-10-08 DIAGNOSIS — M19071 Primary osteoarthritis, right ankle and foot: Secondary | ICD-10-CM | POA: Diagnosis not present

## 2017-10-08 DIAGNOSIS — Z79899 Other long term (current) drug therapy: Secondary | ICD-10-CM | POA: Diagnosis not present

## 2017-10-08 DIAGNOSIS — M19041 Primary osteoarthritis, right hand: Secondary | ICD-10-CM

## 2017-10-08 DIAGNOSIS — Z8249 Family history of ischemic heart disease and other diseases of the circulatory system: Secondary | ICD-10-CM

## 2017-10-08 DIAGNOSIS — M19072 Primary osteoarthritis, left ankle and foot: Secondary | ICD-10-CM

## 2017-10-08 LAB — CBC WITH DIFFERENTIAL/PLATELET
BASOS ABS: 63 {cells}/uL (ref 0–200)
Basophils Relative: 0.9 %
EOS PCT: 2.3 %
Eosinophils Absolute: 161 cells/uL (ref 15–500)
HEMATOCRIT: 45 % (ref 38.5–50.0)
HEMOGLOBIN: 15.3 g/dL (ref 13.2–17.1)
LYMPHS ABS: 1820 {cells}/uL (ref 850–3900)
MCH: 30 pg (ref 27.0–33.0)
MCHC: 34 g/dL (ref 32.0–36.0)
MCV: 88.2 fL (ref 80.0–100.0)
MPV: 12.6 fL — ABNORMAL HIGH (ref 7.5–12.5)
Monocytes Relative: 10.7 %
NEUTROS ABS: 4207 {cells}/uL (ref 1500–7800)
NEUTROS PCT: 60.1 %
Platelets: 248 10*3/uL (ref 140–400)
RBC: 5.1 10*6/uL (ref 4.20–5.80)
RDW: 12.8 % (ref 11.0–15.0)
Total Lymphocyte: 26 %
WBC: 7 10*3/uL (ref 3.8–10.8)
WBCMIX: 749 {cells}/uL (ref 200–950)

## 2017-10-08 LAB — COMPLETE METABOLIC PANEL WITH GFR
AG RATIO: 1.4 (calc) (ref 1.0–2.5)
ALBUMIN MSPROF: 4.3 g/dL (ref 3.6–5.1)
ALT: 30 U/L (ref 9–46)
AST: 26 U/L (ref 10–35)
Alkaline phosphatase (APISO): 88 U/L (ref 40–115)
BUN: 16 mg/dL (ref 7–25)
CALCIUM: 9.6 mg/dL (ref 8.6–10.3)
CO2: 27 mmol/L (ref 20–32)
CREATININE: 0.9 mg/dL (ref 0.70–1.33)
Chloride: 104 mmol/L (ref 98–110)
GFR, Est African American: 113 mL/min/{1.73_m2} (ref >=60)
GFR, Est Non African American: 98 mL/min/{1.73_m2} (ref >=60)
GLOBULIN: 3 g/dL (ref 1.9–3.7)
Glucose, Bld: 79 mg/dL (ref 65–99)
Potassium: 4.1 mmol/L (ref 3.5–5.3)
Sodium: 138 mmol/L (ref 135–146)
Total Bilirubin: 0.9 mg/dL (ref 0.2–1.2)
Total Protein: 7.3 g/dL (ref 6.1–8.1)

## 2017-10-08 NOTE — Patient Instructions (Signed)
Standing Labs We placed an order today for your standing lab work.    Please come back and get your standing labs in February 2019 and every 3 months  We have open lab Monday through Friday from 8:30-11:30 AM and 1:30-4 PM at the office of Dr. Pollyann Savoy.   The office is located at [address], [place], [place] [address] No appointment is necessary.   Labs are drawn by [facility].  You may receive a bill from [facility] for your lab work. If you have any questions regarding directions or hours of operation,  please call [phone].

## 2017-10-10 ENCOUNTER — Other Ambulatory Visit: Payer: Self-pay | Admitting: Rheumatology

## 2017-10-12 NOTE — Telephone Encounter (Signed)
Last Visit: 10/08/17 Next Visit: 03/09/18  Okay to refill per Dr. Corliss Skains

## 2017-12-23 ENCOUNTER — Other Ambulatory Visit: Payer: Self-pay | Admitting: Rheumatology

## 2017-12-23 NOTE — Telephone Encounter (Signed)
Last Visit: 10/08/17 Next Visit: 03/09/18 Labs: 10/08/17 WNL TB Gold: 05/05/17 Neg   Okay to refill per Dr. Corliss Skains

## 2018-02-24 NOTE — Progress Notes (Signed)
Office Visit Note  Patient: Ricky Hardin             Date of Birth: Mar 18, 1966           MRN: [id number]             PCP: Patient, No Pcp Per Referring: No ref. provider found Visit Date: 03/09/2018 Occupation: @GUAROCC @    Subjective:  Medication management.   History of Present Illness: REVERE MAAHS is a 52 y.o. male with history of seropositive rheumatoid arthritis and osteoarthritis overlap.  He states he has been doing quite well on current combination of medications.  After his last visit he has increased interval between his [name] injections to every third week.  He states he has been taking methotrexate also every other week.  He does not notice any joint swelling he does have a stiffness and discomfort in joints off and on.  Activities of Daily Living:  Patient reports morning stiffness for 0 minute.   Patient Denies nocturnal pain.  Difficulty dressing/grooming: Denies Difficulty climbing stairs: Denies Difficulty getting out of chair: Denies Difficulty using hands for taps, buttons, cutlery, and/or writing: Denies   Review of Systems  Constitutional: Negative for fatigue and night sweats.  HENT: Negative for mouth sores, mouth dryness and nose dryness.   Eyes: Negative for redness and dryness.  Respiratory: Negative for shortness of breath and difficulty breathing.   Cardiovascular: Negative for chest pain, palpitations, hypertension, irregular heartbeat and swelling in legs/feet.  Gastrointestinal: Negative for constipation and diarrhea.  Endocrine: Negative for increased urination.  Musculoskeletal: Negative for arthralgias, joint pain, joint swelling, myalgias, muscle weakness, morning stiffness, muscle tenderness and myalgias.  Skin: Negative for color change, rash, hair loss, nodules/bumps, skin tightness, ulcers and sensitivity to sunlight.  Allergic/Immunologic: Negative for susceptible to infections.  Neurological: Negative for dizziness, fainting, memory  loss, night sweats and weakness ( ).  Hematological: Negative for swollen glands.  Psychiatric/Behavioral: Negative for depressed mood and sleep disturbance. The patient is not nervous/anxious.     PMFS History:  Patient Active Problem List   Diagnosis Date Noted  . RA (rheumatoid arthritis) (HCC) 09/19/2016  . Osteoarthritis of both hands 09/19/2016  . Osteoarthritis of both feet 09/19/2016    Past Medical History:  Diagnosis Date  . Osteoarthritis of both feet 09/19/2016  . Osteoarthritis of both hands 09/19/2016  . RA (rheumatoid arthritis) (HCC) 09/19/2016   +RF, +CCP Erosive disease    Family History  Problem Relation Age of Onset  . Heart disease Father 32  . Rheum arthritis Sister    History reviewed. No pertinent surgical history. Social History   Social History Narrative  . Not on file     Objective: Vital Signs: BP 108/68 (BP Location: Left Arm, Patient Position: Sitting, Cuff Size: Normal)   Pulse 69   Resp 14   Ht 5\' 10"  (1.778 m)   Wt 196 lb (88.9 kg)   BMI 28.12 kg/m    Physical Exam  Constitutional: He is oriented to person, place, and time. He appears well-developed and well-nourished.  HENT:  Head: Normocephalic and atraumatic.  Eyes: Pupils are equal, round, and reactive to light. Conjunctivae and EOM are normal.  Neck: Normal range of motion. Neck supple.  Cardiovascular: Normal rate, regular rhythm and normal heart sounds.  Pulmonary/Chest: Effort normal and breath sounds normal.  Abdominal: Soft. Bowel sounds are normal.  Neurological: He is alert and oriented to person, place, and time.  Skin: Skin is  warm and dry. Capillary refill takes less than 2 seconds.  Psychiatric: He has a normal mood and affect. His behavior is normal.  Nursing note and vitals reviewed.    Musculoskeletal Exam: C-spine thoracic lumbar spine good range of motion.  Shoulder joints, elbow joints, wrist joints, MCPs PIPs and DIPs were in good range of motion with no  synovitis.  Hip joints knee joints ankles MTPs PIPs were in good range of motion with no synovitis.  CDAI Exam: CDAI Homunculus Exam:   Joint Counts:  CDAI Tender Joint count: 0 CDAI Swollen Joint count: 0  Global Assessments:  Patient Global Assessment: 1 Provider Global Assessment: 1  CDAI Calculated Score: 2    Investigation: No additional findings.TB Gold: 05/05/2017 Negative  CBC Latest Ref Rng & Units 10/08/2017 05/05/2017 02/20/2017  WBC 3.8 - 10.8 Thousand/uL 7.0 7.7 5.6  Hemoglobin 13.2 - 17.1 g/dL 62.8 36.6 29.4  Hematocrit 38.5 - 50.0 % 45.0 47.2 46.0  Platelets 140 - 400 Thousand/uL 248 246 245   CMP Latest Ref Rng & Units 10/08/2017 05/05/2017 02/20/2017  Glucose 65 - 99 mg/dL 79 78 85  BUN 7 - 25 mg/dL 16 16 17   Creatinine 0.70 - 1.33 mg/dL 7.65 4.65  Sodium 135 - 146 mmol/L 138 137 141  Potassium 3.5 - 5.3 mmol/L 4.1 4.6 4.1  Chloride 98 - 110 mmol/L 104 102 104  CO2 20 - 32 mmol/L 27 24 24   Calcium 8.6 - 10.3 mg/dL 9.6 9.4 9.5  Total Protein 6.1 - 8.1 g/dL 7.3 7.6 7.1  Total Bilirubin 0.2 - 1.2 mg/dL 0.9 0.9 0.9  Alkaline Phos 40 - 115 U/L - 83 73  AST 10 - 35 U/L 26 25 25   ALT 9 - 46 U/L 30 27 31    Imaging: No results found.  Speciality Comments: No specialty comments available.    Procedures:  No procedures performed Allergies: Patient has no known allergies.   Assessment / Plan:     Visit Diagnoses: Rheumatoid arthritis involving multiple sites with positive rheumatoid factor (HCC) - Patient has positive rheumatoid factor, positive CCP and history of erosive disease.  He is clinically doing well with no synovitis on examination.  He has spaced his [name] to every 3 weeks.  He has been taking methotrexate every other week.  I have advised him to resume methotrexate to every week andHumira to every fourth week.  If he has a flare he supposed to notify 0.35.  High risk medication use - methotrexate 0.7 mL subcutaneous every week, folic acid 2 mg by  mouth daily, [name] 40 mg subcutaneous every 3 weeks - Plan: CBC with Differential/Platelet, COMPLETE METABOLIC PANEL WITH GFR, QuantiFERON-TB Gold Plus today and then CBC and CMP will be checked every 3 months.  Primary osteoarthritis of both hands-he has some stiffness of the hands but not much discomfort.  Primary osteoarthritis of both feet  Family history of heart disease - in father and brother     Orders: Orders Placed This Encounter  Procedures  . CBC with Differential/Platelet  . COMPLETE METABOLIC PANEL WITH GFR  . QuantiFERON-TB Gold Plus   No orders of the defined types were placed in this encounter.   Face-to-face time spent with patient was 20 minutes. >50% of time was spent in counseling and coordination of care.  Follow-Up Instructions: Return in about 5 months (around 08/09/2018) for Rheumatoid arthritis.   , MD  Note - This record has been created using .  Chart creation errors have been sought, but may not always  have been located. Such creation errors do not reflect on  the standard of medical care.

## 2018-03-09 ENCOUNTER — Ambulatory Visit (INDEPENDENT_AMBULATORY_CARE_PROVIDER_SITE_OTHER): Payer: Commercial Managed Care - PPO | Admitting: Rheumatology

## 2018-03-09 ENCOUNTER — Encounter: Payer: Self-pay | Admitting: Rheumatology

## 2018-03-09 VITALS — BP 108/68 | HR 69 | Resp 14 | Ht 70.0 in | Wt 196.0 lb

## 2018-03-09 DIAGNOSIS — Z79899 Other long term (current) drug therapy: Secondary | ICD-10-CM | POA: Diagnosis not present

## 2018-03-09 DIAGNOSIS — M0579 Rheumatoid arthritis with rheumatoid factor of multiple sites without organ or systems involvement: Secondary | ICD-10-CM

## 2018-03-09 DIAGNOSIS — Z8249 Family history of ischemic heart disease and other diseases of the circulatory system: Secondary | ICD-10-CM | POA: Diagnosis not present

## 2018-03-09 DIAGNOSIS — M19071 Primary osteoarthritis, right ankle and foot: Secondary | ICD-10-CM | POA: Diagnosis not present

## 2018-03-09 DIAGNOSIS — M19072 Primary osteoarthritis, left ankle and foot: Secondary | ICD-10-CM | POA: Diagnosis not present

## 2018-03-09 DIAGNOSIS — M19041 Primary osteoarthritis, right hand: Secondary | ICD-10-CM | POA: Diagnosis not present

## 2018-03-09 DIAGNOSIS — M19042 Primary osteoarthritis, left hand: Secondary | ICD-10-CM

## 2018-03-09 LAB — COMPLETE METABOLIC PANEL WITH GFR
AG RATIO: 1.4 (calc) (ref 1.0–2.5)
ALT: 23 U/L (ref 9–46)
AST: 23 U/L (ref 10–35)
Albumin: 4.3 g/dL (ref 3.6–5.1)
Alkaline phosphatase (APISO): 78 U/L (ref 40–115)
BUN: 20 mg/dL (ref 7–25)
CALCIUM: 9.3 mg/dL (ref 8.6–10.3)
CO2: 26 mmol/L (ref 20–32)
Chloride: 105 mmol/L (ref 98–110)
Creat: 0.95 mg/dL (ref 0.70–1.33)
GFR, EST AFRICAN AMERICAN: 106 mL/min/{1.73_m2} (ref >=60)
GFR, Est Non African American: 92 mL/min/{1.73_m2} (ref >=60)
Globulin: 3 g/dL (calc) (ref 1.9–3.7)
Glucose, Bld: 115 mg/dL — ABNORMAL HIGH (ref 65–99)
POTASSIUM: 4 mmol/L (ref 3.5–5.3)
Sodium: 139 mmol/L (ref 135–146)
TOTAL PROTEIN: 7.3 g/dL (ref 6.1–8.1)
Total Bilirubin: 1 mg/dL (ref 0.2–1.2)

## 2018-03-09 LAB — CBC WITH DIFFERENTIAL/PLATELET
BASOS PCT: 0.7 %
Basophils Absolute: 51 cells/uL (ref 0–200)
EOS PCT: 2.6 %
Eosinophils Absolute: 190 cells/uL (ref 15–500)
HCT: 45.8 % (ref 38.5–50.0)
Hemoglobin: 15.6 g/dL (ref 13.2–17.1)
LYMPHS ABS: 1796 {cells}/uL (ref 850–3900)
MCH: 29.7 pg (ref 27.0–33.0)
MCHC: 34.1 g/dL (ref 32.0–36.0)
MCV: 87.1 fL (ref 80.0–100.0)
MPV: 12.2 fL (ref 7.5–12.5)
Monocytes Relative: 8.7 %
Neutro Abs: 4628 cells/uL (ref 1500–7800)
Neutrophils Relative %: 63.4 %
PLATELETS: 216 10*3/uL (ref 140–400)
RBC: 5.26 10*6/uL (ref 4.20–5.80)
RDW: 12.8 % (ref 11.0–15.0)
TOTAL LYMPHOCYTE: 24.6 %
WBC: 7.3 10*3/uL (ref 3.8–10.8)
WBCMIX: 635 {cells}/uL (ref 200–950)

## 2018-03-09 NOTE — Patient Instructions (Addendum)
Natural anti-inflammatories  You can purchase these at [employer], [employer] or online.  . Turmeric (capsules)  . Ginger (ginger root or capsules)  . Omega 3 (Fish, flax seeds, chia seeds, walnuts, almonds)  . Tart cherry (dried or extract)   Patient should be under the care of a physician while taking these supplements. This may not be reproduced without the permission of Dr. Pollyann Savoy.   Standing Labs We placed an order today for your standing lab work.    Please come back and get your standing labs in August and every 3 months  We have open lab Monday through Friday from 8:30-11:30 AM and 1:30-4:00 PM  at the office of Dr. Pollyann Savoy.   You may experience shorter wait times on Monday and Friday afternoons. The office is located at [address], [address], [place], [place] [address] No appointment is necessary.   Labs are drawn by [facility].  You may receive a bill from [facility] for your lab work. If you have any questions regarding directions or hours of operation,  please call [phone].

## 2018-03-11 LAB — QUANTIFERON-TB GOLD PLUS
Mitogen-NIL: >10 IU/mL
NIL: 0.02 [IU]/mL
QuantiFERON-TB Gold Plus: NEGATIVE
TB1-NIL: 0.01 IU/mL

## 2018-03-23 ENCOUNTER — Other Ambulatory Visit: Payer: Self-pay | Admitting: Rheumatology

## 2018-03-23 NOTE — Telephone Encounter (Signed)
Last visit: 03/09/18 Next visit: 08/10/18 Labs: 03/09/18 Glucose mildly elevated. All other lab values are WNL.  Okay to refill per Dr. Corliss Skains

## 2018-04-20 ENCOUNTER — Other Ambulatory Visit: Payer: Self-pay | Admitting: Rheumatology

## 2018-04-20 NOTE — Telephone Encounter (Signed)
Last visit: 03/09/18 Next visit: 08/10/18  Okay to refill per Dr. Corliss Skains

## 2018-05-19 ENCOUNTER — Other Ambulatory Visit: Payer: Self-pay | Admitting: Rheumatology

## 2018-05-20 NOTE — Telephone Encounter (Signed)
Last Visit: 03/09/18 Next Visit: 08/10/18 Labs: 03/09/18 Glucose mildly elevated. All other lab values are WNL. TB gold: 03/09/18 Neg   Okay to refill per Dr. Corliss Skains

## 2018-05-21 ENCOUNTER — Other Ambulatory Visit: Payer: Self-pay | Admitting: Rheumatology

## 2018-05-21 NOTE — Telephone Encounter (Signed)
Last visit: 03/09/18 Next visit: 08/10/18 Labs: 03/09/18 Glucose mildly elevated. All other lab values are WNL.  Okay to refill per Dr. Deveshwar 

## 2018-07-27 NOTE — Progress Notes (Signed)
Office Visit Note  Patient: Ricky Hardin             Date of Birth: 12/17/1965           MRN: [id number]             PCP: Patient, No Pcp Per Referring: No ref. provider found Visit Date: 08/10/2018 Occupation: @GUAROCC @  Subjective:  Medication monitoring  History of Present Illness: Ricky Hardin is a 52 y.o. male with history of seropositive rheumatoid arthritis and osteoarthritis.  Patient is on [name] 40 mg sq every 3 weeks, MTX 0.7 ml once weekly, and folic acid 2 mg daily.  He denies any recent rheumatoid arthritis flares.  He denies any joint pain or joint swelling at this time.  He denies any joint stiffness.  He reports that he tried spacing his [name] injections every 4 weeks but developed increased joint discomfort and resumed every 3 weeks.  He states that he is taking turmeric and omega-3 but is curious if there is any other natural anti-inflammatories that he can start taking.  He reports that he is apprehensive to get influenza vaccine.  He states that he does go for yearly skin exams though.   Activities of Daily Living:  Patient reports morning stiffness for 0  minutes.   Patient Denies nocturnal pain.  Difficulty dressing/grooming: Denies Difficulty climbing stairs: Denies Difficulty getting out of chair: Denies Difficulty using hands for taps, buttons, cutlery, and/or writing: Denies  Review of Systems  Constitutional: Negative for fatigue and night sweats.  HENT: Negative for mouth sores, trouble swallowing, trouble swallowing, mouth dryness and nose dryness.   Eyes: Negative for redness, visual disturbance and dryness.  Respiratory: Negative for cough, hemoptysis, shortness of breath and difficulty breathing.   Cardiovascular: Negative for chest pain, palpitations, hypertension, irregular heartbeat and swelling in legs/feet.  Gastrointestinal: Negative for blood in stool, constipation and diarrhea.  Endocrine: Negative for increased urination.  Genitourinary:  Negative for painful urination.  Musculoskeletal: Negative for arthralgias, joint pain, joint swelling, myalgias, muscle weakness, morning stiffness, muscle tenderness and myalgias.  Skin: Negative for color change, rash, hair loss, nodules/bumps, skin tightness, ulcers and sensitivity to sunlight.  Allergic/Immunologic: Negative for susceptible to infections.  Neurological: Negative for dizziness, fainting, memory loss, night sweats and weakness.  Hematological: Negative for swollen glands.  Psychiatric/Behavioral: Negative for depressed mood and sleep disturbance. The patient is not nervous/anxious.     PMFS History:  Patient Active Problem List   Diagnosis Date Noted  . RA (rheumatoid arthritis) (HCC) 09/19/2016  . Osteoarthritis of both hands 09/19/2016  . Osteoarthritis of both feet 09/19/2016    Past Medical History:  Diagnosis Date  . Osteoarthritis of both feet 09/19/2016  . Osteoarthritis of both hands 09/19/2016  . RA (rheumatoid arthritis) (HCC) 09/19/2016   +RF, +CCP Erosive disease    Family History  Problem Relation Age of Onset  . Heart disease Father 45  . Rheum arthritis Sister    History reviewed. No pertinent surgical history. Social History   Social History Narrative  . Not on file    Objective: Vital Signs: BP 100/68 (BP Location: Left Arm, Patient Position: Sitting, Cuff Size: Large)   Pulse 86   Resp 13   Ht 5\' 10"  (1.778 m)   Wt 193 lb (87.5 kg)   BMI 27.69 kg/m    Physical Exam  Constitutional: He is oriented to person, place, and time. He appears well-developed and well-nourished.  HENT:  Head:  Normocephalic and atraumatic.  Eyes: Pupils are equal, round, and reactive to light. Conjunctivae and EOM are normal.  Neck: Normal range of motion. Neck supple.  Cardiovascular: Normal rate, regular rhythm and normal heart sounds.  Pulmonary/Chest: Effort normal and breath sounds normal.  Abdominal: Soft. Bowel sounds are normal.  Lymphadenopathy:     He has no cervical adenopathy.  Neurological: He is alert and oriented to person, place, and time.  Skin: Skin is warm and dry. Capillary refill takes less than 2 seconds.  Psychiatric: He has a normal mood and affect. His behavior is normal.  Nursing note and vitals reviewed.    Musculoskeletal Exam: C-spine, thoracic spine, lumbar spine good range of motion.  No midline spinal tenderness.  No SI joint tenderness.  Shoulder joints, elbow joints, wrist joints, MCPs, PIPs, DIPs good range of motion no synovitis.  He has PIP and DIP synovial thickening consistent with osteoarthritis of bilateral hands.  He is complete fist formation bilaterally.  Hip joints, knee joints, ankle joints, MTPs, PIPs, DIPs good range of motion no synovitis.  No warmth or effusion of bilateral knee joints.  No tenderness or swelling of ankle joints.  No tenderness of trochanter bursa bilaterally.  CDAI Exam: CDAI Score: 0.2  Patient Global Assessment: 1 (mm); Provider Global Assessment: 1 (mm) Swollen: 0 ; Tender: 0  Joint Exam   Not documented   There is currently no information documented on the homunculus. Go to the Rheumatology activity and complete the homunculus joint exam.  Investigation: No additional findings.  Imaging: No results found.  Recent Labs: Lab Results  Component Value Date   WBC 7.3 03/09/2018   HGB 15.6 03/09/2018   PLT 216 03/09/2018   NA 139 03/09/2018   K 4.0 03/09/2018   CL 105 03/09/2018   CO2 26 03/09/2018   GLUCOSE 115 (H) 03/09/2018   BUN 20 03/09/2018   CREATININE 0.95 03/09/2018   BILITOT 1.0 03/09/2018   ALKPHOS 83 05/05/2017   AST 23 03/09/2018   ALT 23 03/09/2018   PROT 7.3 03/09/2018   ALBUMIN 4.3 05/05/2017   CALCIUM 9.3 03/09/2018   GFRAA 106 03/09/2018   QFTBGOLDPLUS NEGATIVE 03/09/2018    Speciality Comments: No specialty comments available.  Procedures:  No procedures performed Allergies: Patient has no known allergies.   Assessment / Plan:      Visit Diagnoses: Rheumatoid arthritis involving multiple sites with positive rheumatoid factor (HCC): He has no synovitis on exam.  He has not had any recent rheumatoid arthritis flares.  He has been injecting [name] 40 mg subcutaneously every 21 days, methotrexate 0.7 mL once weekly and taking folic acid 2 mg daily.  A refill of the new formulation of [name] will be sent to the pharmacy today.  He has no joint pain or joint swelling.  He has no joint stiffness.  He is clinically doing well.  He will continue on current treatment regimen.  He was encouraged to get the yearly influenza vaccine.  We also discussed the importance of yearly skin exams.  He will follow up in 5 months.  He was advised to notify us if he develops increased joint pain or joint swelling.   High risk medication use - [name] 40 mg sq every 21 days, MTX 0.7 ml once weekly, and folic acid 2 mg-CBC and CMP will be drawn today to monitor for drug toxicity.  He will return in January every 3 months for lab work.  TB gold negative on 03/09/2018.- Plan:  CBC with Differential/Platelet, COMPLETE METABOLIC PANEL WITH GFR  Primary osteoarthritis of both hands: He has PIP and DIP synovial thickening consistent with osteoarthritis of bilateral hands.  He is complete fist formation bilaterally.  No synovitis was noted.  Joint protection and muscle strengthening were discussed.  He was provided a list of natural anti-inflammatories that he can try.  Primary osteoarthritis of both feet: He has osteoarthritic changes in bilateral feet.  He has no discomfort in his feet at this time.  He was proper fitting shoes.  Other medical conditions are listed as follows:  Family history of heart disease  Chronic midline low back pain with left-sided sciatica   Orders: Orders Placed This Encounter  Procedures  . CBC with Differential/Platelet  . COMPLETE METABOLIC PANEL WITH GFR   No orders of the defined types were placed in this  encounter.   Face-to-face time spent with patient was 30 minutes. Greater than 50% of time was spent in counseling and coordination of care.  Follow-Up Instructions: Return in about 5 months (around 01/09/2019) for Rheumatoid arthritis, Osteoarthritis.   Gearldine Bienenstock, PA-C  Note - This record has been created using Dragon software.  Chart creation errors have been sought, but may not always  have been located. Such creation errors do not reflect on  the standard of medical care.

## 2018-08-10 ENCOUNTER — Encounter: Payer: Self-pay | Admitting: Physician Assistant

## 2018-08-10 ENCOUNTER — Ambulatory Visit (INDEPENDENT_AMBULATORY_CARE_PROVIDER_SITE_OTHER): Payer: Commercial Managed Care - PPO | Admitting: Physician Assistant

## 2018-08-10 VITALS — BP 100/68 | HR 86 | Resp 13 | Ht 70.0 in | Wt 193.0 lb

## 2018-08-10 DIAGNOSIS — Z8249 Family history of ischemic heart disease and other diseases of the circulatory system: Secondary | ICD-10-CM

## 2018-08-10 DIAGNOSIS — M19042 Primary osteoarthritis, left hand: Secondary | ICD-10-CM

## 2018-08-10 DIAGNOSIS — Z79899 Other long term (current) drug therapy: Secondary | ICD-10-CM | POA: Diagnosis not present

## 2018-08-10 DIAGNOSIS — M0579 Rheumatoid arthritis with rheumatoid factor of multiple sites without organ or systems involvement: Secondary | ICD-10-CM | POA: Diagnosis not present

## 2018-08-10 DIAGNOSIS — M19041 Primary osteoarthritis, right hand: Secondary | ICD-10-CM

## 2018-08-10 DIAGNOSIS — M19071 Primary osteoarthritis, right ankle and foot: Secondary | ICD-10-CM

## 2018-08-10 DIAGNOSIS — M5442 Lumbago with sciatica, left side: Secondary | ICD-10-CM

## 2018-08-10 DIAGNOSIS — G8929 Other chronic pain: Secondary | ICD-10-CM

## 2018-08-10 DIAGNOSIS — M19072 Primary osteoarthritis, left ankle and foot: Secondary | ICD-10-CM

## 2018-08-10 NOTE — Patient Instructions (Addendum)
Supplements for Osteoarthritis Natural anti-inflammatories can help reduce inflammation and joint stiffness without some of the harmful side effects non-steroidal anti-inflammatories (Advil, Motrin, Aleve, etc.)  Tumeric . Recommended dose 400 mg to 600 mg once daily (can cause stomach upset, may increase to three times daily as tolerated) . Do not take if you are on a blood thinner and stop prior to surgery .  Ginger (root or capsules) . Recommended dose 2 grams in three divided doses or 4 cups of tea daily . Do not take if you are on a blood thinner and stop prior to surgery .  Fish oil or Omega 3 . Two 3 ounce servings of fish a week or 2-3 grams twice daily . Make sure it contains at least 30% of EPA/DHA .  Tart Cherry (dried or extract or tablets) . Recommended dose 500 mg twice daily You may be able to find some of these products at your local pharmacy but may also purchase at [employer], [employer], other specialty stores or online.  *Although these are natural products they can still interact with medications.  Always consult with your doctor or pharmacist when starting new supplements and medications*  Patient should be under the care of a physician while taking these supplements. This may not be reproduced without the permission of Dr. Pollyann Savoy. Standing Labs We placed an order today for your standing lab work.      Please come back and get your standing labs in January and every 3 months   We have open lab Monday through Friday from 8:30-11:30 AM and 1:30-4:00 PM  at the office of Dr. Pollyann Savoy.   You may experience shorter wait times on Monday and Friday afternoons. The office is located at [address], [place], [place] [address] No appointment is necessary.   Labs are drawn by [facility].  You may receive a bill from [facility] for your lab work. If you have any questions regarding directions or hours of operation,  please call [phone].    Just as a reminder please drink plenty of water prior to coming for your lab work. Thanks!

## 2018-08-11 LAB — CBC WITH DIFFERENTIAL/PLATELET
Basophils Absolute: 47 cells/uL (ref 0–200)
Basophils Relative: 0.7 %
EOS PCT: 2.7 %
Eosinophils Absolute: 181 cells/uL (ref 15–500)
HCT: 44.7 % (ref 38.5–50.0)
Hemoglobin: 15.2 g/dL (ref 13.2–17.1)
Lymphs Abs: 1735 cells/uL (ref 850–3900)
MCH: 30.3 pg (ref 27.0–33.0)
MCHC: 34 g/dL (ref 32.0–36.0)
MCV: 89.2 fL (ref 80.0–100.0)
MPV: 12 fL (ref 7.5–12.5)
Monocytes Relative: 8.8 %
NEUTROS PCT: 61.9 %
Neutro Abs: 4147 cells/uL (ref 1500–7800)
PLATELETS: 238 10*3/uL (ref 140–400)
RBC: 5.01 10*6/uL (ref 4.20–5.80)
RDW: 13.1 % (ref 11.0–15.0)
TOTAL LYMPHOCYTE: 25.9 %
WBC mixed population: 590 cells/uL (ref 200–950)
WBC: 6.7 10*3/uL (ref 3.8–10.8)

## 2018-08-11 LAB — COMPLETE METABOLIC PANEL WITH GFR
AG RATIO: 1.6 (calc) (ref 1.0–2.5)
ALBUMIN MSPROF: 4.3 g/dL (ref 3.6–5.1)
ALT: 24 U/L (ref 9–46)
AST: 21 U/L (ref 10–35)
Alkaline phosphatase (APISO): 85 U/L (ref 40–115)
BUN: 19 mg/dL (ref 7–25)
CO2: 24 mmol/L (ref 20–32)
Calcium: 9.2 mg/dL (ref 8.6–10.3)
Chloride: 105 mmol/L (ref 98–110)
Creat: 0.92 mg/dL (ref 0.70–1.33)
GFR, EST AFRICAN AMERICAN: 110 mL/min/{1.73_m2} (ref >=60)
GFR, EST NON AFRICAN AMERICAN: 95 mL/min/{1.73_m2} (ref >=60)
Globulin: 2.7 g/dL (calc) (ref 1.9–3.7)
Glucose, Bld: 118 mg/dL — ABNORMAL HIGH (ref 65–99)
POTASSIUM: 3.6 mmol/L (ref 3.5–5.3)
Sodium: 139 mmol/L (ref 135–146)
TOTAL PROTEIN: 7 g/dL (ref 6.1–8.1)
Total Bilirubin: 0.9 mg/dL (ref 0.2–1.2)

## 2018-08-11 NOTE — Progress Notes (Signed)
Glucose is 118.  All other labs are WNL.

## 2018-08-30 ENCOUNTER — Other Ambulatory Visit: Payer: Self-pay | Admitting: Rheumatology

## 2018-08-30 NOTE — Telephone Encounter (Signed)
Last Visit: 08/10/18 Next visit: 01/10/18 Labs: 08/10/18 Glucose is 118. All other labs are WNL.  Okay to refill per Dr. Corliss Skains

## 2018-09-01 ENCOUNTER — Telehealth: Payer: Self-pay | Admitting: Pharmacy Technician

## 2018-09-01 DIAGNOSIS — M0579 Rheumatoid arthritis with rheumatoid factor of multiple sites without organ or systems involvement: Secondary | ICD-10-CM

## 2018-09-01 NOTE — Telephone Encounter (Signed)
Received prior authorization request from [facility] for [name] 40mg /0.67ml, but per last office visit note says we should have sent new formulation to pharmacy. No record of new prescription in chart. Will submit prior authorization for new formulation [name] 40mg /0.65ml pen. Can you please send new prescription to pharmacy?

## 2018-09-01 NOTE — Telephone Encounter (Signed)
Received a Prior Authorization request from [facility] for [name]. Authorization has been submitted to patient's insurance via Cover My Meds. Will update once we receive a response.  10:06 AM Dorthula Nettles, CPhT

## 2018-09-02 MED ORDER — ADALIMUMAB 40 MG/0.4ML ~~LOC~~ AJKT: 40.0000 mg | AUTO-INJECTOR | SUBCUTANEOUS | 0 refills | Status: DC

## 2018-09-02 NOTE — Addendum Note (Signed)
Addended by: Verlin Fester C on: 09/02/2018 09:03 AM   Modules accepted: Orders

## 2018-09-02 NOTE — Telephone Encounter (Signed)
Prescription refill was not sent in at last office visit on 08-10-18.  Per note patient wishes to switch to citrate free formulation of [name].  Prescription sent to [facility] specialty pharmacy for [name] 40 mg/0.4 mL injecting every 21 days.  Pending PA approval.

## 2018-09-06 NOTE — Telephone Encounter (Signed)
Received a fax from [facility] regarding a prior authorization for [name]. Authorization has been APPROVED from 09/01/2018 to 09/01/2019.   Will send document to scan center.  Authorization # [id number] Phone # [phone]  8:59 AM Dorthula Nettles, CPhT

## 2018-11-05 ENCOUNTER — Other Ambulatory Visit: Payer: Self-pay | Admitting: Rheumatology

## 2018-11-05 DIAGNOSIS — M0579 Rheumatoid arthritis with rheumatoid factor of multiple sites without organ or systems involvement: Secondary | ICD-10-CM

## 2018-11-05 NOTE — Telephone Encounter (Signed)
Last Visit: 08/10/18 Next visit: 01/11/19 Labs: 08/10/18 Glucose is 118. All other labs are WNL. TB Gold: 03/09/18 Neg   Okay to refill per Dr. Corliss Skains

## 2018-11-14 ENCOUNTER — Other Ambulatory Visit: Payer: Self-pay | Admitting: Rheumatology

## 2018-11-15 NOTE — Telephone Encounter (Signed)
Last Visit: 08/10/18 Next visit: 01/10/18 Labs: 08/10/18 Glucose is 118. All other labs are WNL.  Left message to advise patient he is due to update labs.   Okay to refill 30 day supply per Dr. Corliss Skains

## 2018-11-17 ENCOUNTER — Other Ambulatory Visit: Payer: Self-pay | Admitting: *Deleted

## 2018-11-17 DIAGNOSIS — Z79899 Other long term (current) drug therapy: Secondary | ICD-10-CM

## 2018-11-18 LAB — CBC WITH DIFFERENTIAL/PLATELET
Absolute Monocytes: 622 cells/uL (ref 200–950)
BASOS PCT: 1 %
Basophils Absolute: 61 cells/uL (ref 0–200)
EOS PCT: 2.6 %
Eosinophils Absolute: 159 cells/uL (ref 15–500)
HCT: 43.2 % (ref 38.5–50.0)
HEMOGLOBIN: 14.8 g/dL (ref 13.2–17.1)
LYMPHS ABS: 1549 {cells}/uL (ref 850–3900)
MCH: 30.8 pg (ref 27.0–33.0)
MCHC: 34.3 g/dL (ref 32.0–36.0)
MCV: 89.8 fL (ref 80.0–100.0)
MONOS PCT: 10.2 %
MPV: 12.1 fL (ref 7.5–12.5)
NEUTROS ABS: 3709 {cells}/uL (ref 1500–7800)
Neutrophils Relative %: 60.8 %
Platelets: 244 10*3/uL (ref 140–400)
RBC: 4.81 10*6/uL (ref 4.20–5.80)
RDW: 12.9 % (ref 11.0–15.0)
Total Lymphocyte: 25.4 %
WBC: 6.1 10*3/uL (ref 3.8–10.8)

## 2018-11-18 LAB — COMPLETE METABOLIC PANEL WITH GFR
AG Ratio: 1.8 (calc) (ref 1.0–2.5)
ALBUMIN MSPROF: 4.2 g/dL (ref 3.6–5.1)
ALT: 24 U/L (ref 9–46)
AST: 23 U/L (ref 10–35)
Alkaline phosphatase (APISO): 73 U/L (ref 40–115)
BUN: 20 mg/dL (ref 7–25)
CALCIUM: 8.9 mg/dL (ref 8.6–10.3)
CO2: 27 mmol/L (ref 20–32)
CREATININE: 0.98 mg/dL (ref 0.70–1.33)
Chloride: 106 mmol/L (ref 98–110)
GFR, EST AFRICAN AMERICAN: 102 mL/min/{1.73_m2} (ref >=60)
GFR, EST NON AFRICAN AMERICAN: 88 mL/min/{1.73_m2} (ref >=60)
GLOBULIN: 2.4 g/dL (ref 1.9–3.7)
Glucose, Bld: 88 mg/dL (ref 65–99)
Potassium: 4.1 mmol/L (ref 3.5–5.3)
SODIUM: 141 mmol/L (ref 135–146)
Total Bilirubin: 0.9 mg/dL (ref 0.2–1.2)
Total Protein: 6.6 g/dL (ref 6.1–8.1)

## 2018-12-09 ENCOUNTER — Other Ambulatory Visit: Payer: Self-pay | Admitting: Rheumatology

## 2018-12-09 DIAGNOSIS — M0579 Rheumatoid arthritis with rheumatoid factor of multiple sites without organ or systems involvement: Secondary | ICD-10-CM

## 2018-12-09 NOTE — Telephone Encounter (Signed)
Last Visit: 08/10/18 Next visit: 01/10/18 Labs: 11/17/18 WNL TB Gold: 03/09/18 Neg   Okay to refill per Dr. Corliss Skains

## 2018-12-10 ENCOUNTER — Other Ambulatory Visit: Payer: Self-pay | Admitting: Rheumatology

## 2018-12-13 NOTE — Telephone Encounter (Signed)
Last Visit: 08/10/18 Next visit: 01/10/18 Labs: 11/17/18 WNL  Okay to refill per Dr. Corliss Skains

## 2018-12-26 ENCOUNTER — Other Ambulatory Visit: Payer: Self-pay | Admitting: Rheumatology

## 2018-12-28 NOTE — Progress Notes (Signed)
Office Visit Note  Patient: Ricky Hardin             Date of Birth: 14-Dec-1965           MRN: [id number]             PCP: Patient, No Pcp Per Referring: No ref. provider found Visit Date: 01/11/2019 Occupation: @GUAROCC @  Subjective:  Left wrist pain   History of Present Illness: Ricky Hardin is a 53 y.o. male with history of seropositive rheumatoid arthritis and osteoarthritis.  He is on [name] 40 mg sq injections every 21 days,  MTX 0.7 ml sq once weekly, and folic acid 2 mg po daily.  He reports he is not doing quite as well spacing out [name] to every 21 days.  He is having both in both feet and the left wrist joint. He denies any pain at night. He denies any joint swelling.   He reports he thinks he is due for yearly skin exam.   Activities of Daily Living:  Patient reports morning stiffness for 5 minutes.   Patient Denies nocturnal pain.  Difficulty dressing/grooming: Denies Difficulty climbing stairs: Denies Difficulty getting out of chair: Denies Difficulty using hands for taps, buttons, cutlery, and/or writing: Denies  Review of Systems  Constitutional: Negative for fatigue and night sweats.  HENT: Negative for mouth sores, mouth dryness and nose dryness.   Eyes: Negative for redness, visual disturbance and dryness.  Respiratory: Negative for cough, hemoptysis, shortness of breath and difficulty breathing.   Cardiovascular: Negative for chest pain, palpitations, hypertension, irregular heartbeat and swelling in legs/feet.  Gastrointestinal: Negative for blood in stool, constipation and diarrhea.  Endocrine: Negative for increased urination.  Genitourinary: Negative for painful urination.  Musculoskeletal: Positive for arthralgias, joint pain and morning stiffness. Negative for joint swelling, myalgias, muscle weakness, muscle tenderness and myalgias.  Skin: Negative for color change, rash, hair loss, nodules/bumps, skin tightness, ulcers and sensitivity to sunlight.    Allergic/Immunologic: Negative for susceptible to infections.  Neurological: Negative for dizziness, fainting, memory loss, night sweats and weakness.  Hematological: Negative for swollen glands.  Psychiatric/Behavioral: Negative for depressed mood and sleep disturbance. The patient is not nervous/anxious.     PMFS History:  Patient Active Problem List   Diagnosis Date Noted  . RA (rheumatoid arthritis) (HCC) 09/19/2016  . Osteoarthritis of both hands 09/19/2016  . Osteoarthritis of both feet 09/19/2016    Past Medical History:  Diagnosis Date  . Osteoarthritis of both feet 09/19/2016  . Osteoarthritis of both hands 09/19/2016  . RA (rheumatoid arthritis) (HCC) 09/19/2016   +RF, +CCP Erosive disease    Family History  Problem Relation Age of Onset  . Heart disease Father 60  . Rheum arthritis Sister    History reviewed. No pertinent surgical history. Social History   Social History Narrative  . Not on file    There is no immunization history on file for this patient.   Objective: Vital Signs: BP 106/70 (BP Location: Left Arm, Patient Position: Sitting, Cuff Size: Normal)   Pulse 66   Resp 14   Ht 5\' 10"  (1.778 m)   Wt 192 lb 3.2 oz (87.2 kg)   BMI 27.58 kg/m    Physical Exam Vitals signs and nursing note reviewed.  Constitutional:      Appearance: He is well-developed.  HENT:     Head: Normocephalic and atraumatic.  Eyes:     Conjunctiva/sclera: Conjunctivae normal.     Pupils: Pupils are  equal, round, and reactive to light.  Neck:     Musculoskeletal: Normal range of motion and neck supple.  Cardiovascular:     Rate and Rhythm: Normal rate and regular rhythm.     Heart sounds: Normal heart sounds.  Pulmonary:     Effort: Pulmonary effort is normal.     Breath sounds: Normal breath sounds.  Abdominal:     General: Bowel sounds are normal.     Palpations: Abdomen is soft.  Lymphadenopathy:     Cervical: No cervical adenopathy.  Skin:    General: Skin  is warm and dry.     Capillary Refill: Capillary refill takes less than 2 seconds.  Neurological:     Mental Status: He is alert and oriented to person, place, and time.  Psychiatric:        Behavior: Behavior normal.      Musculoskeletal Exam:C-spine, thoracic spine, and lumbar spine good ROM.  No midline spinal tenderness.  No SI joint tenderness.  Shoulder joints, elbow joints, wrist joints, MCPs, PIPs, and DIPs good ROM with no synovitis. Mild left wrist tenderness.  PIP and DIP synovial thickening.  Hip joints, knee joints, ankle joints, MTPs, PIPs, and DIPs good ROM with no synovitis.  No warmth or effusion of knee joints.  No tenderness or swelling of ankle joints.   CDAI Exam: CDAI Score: 0.4  Patient Global Assessment: 2 (mm); Provider Global Assessment: 2 (mm) Swollen: 0 ; Tender: 0  Joint Exam   Not documented   There is currently no information documented on the homunculus. Go to the Rheumatology activity and complete the homunculus joint exam.  Investigation: No additional findings.  Imaging: No results found.  Recent Labs: Lab Results  Component Value Date   WBC 6.1 11/17/2018   HGB 14.8 11/17/2018   PLT 244 11/17/2018   NA 141 11/17/2018   K 4.1 11/17/2018   CL 106 11/17/2018   CO2 27 11/17/2018   GLUCOSE 88 11/17/2018   BUN 20 11/17/2018   CREATININE 0.98 11/17/2018   BILITOT 0.9 11/17/2018   ALKPHOS 83 05/05/2017   AST 23 11/17/2018   ALT 24 11/17/2018   PROT 6.6 11/17/2018   ALBUMIN 4.3 05/05/2017   CALCIUM 8.9 11/17/2018   GFRAA 102 11/17/2018   QFTBGOLDPLUS NEGATIVE 03/09/2018    Speciality Comments: No specialty comments available.  Procedures:  No procedures performed Allergies: Patient has no known allergies.   Assessment / Plan:     Visit Diagnoses: Rheumatoid arthritis involving multiple sites with positive rheumatoid factor (HCC) -He has no synovitis on exam.  He has tenderness of the left wrist joint and has been having increased  discomfort in both feet.  He tried spacing [name] 40 mg to every 21 days and started having increased joint stiffness and arthralgias.  No joint swelling.  He continues to inject MTX 0.7 ml sq once weekly and takes folic acid 2 mg po daily.  He does not need any refills at this time. X-rays of both hands and feet were obtained today to track radiographic progression. No interval changes since 2008. Erosive changes noted. He will increase the frequency of [name] 40 mg injections to every 14 days and continue on MTX 0.7 ml sq once weekly.  He was advised to notify us if he develops increased joint pain or joint swelling.  He will follow up in 5 months. Plan: XR Hand 2 View Left, XR Hand 2 View Right, XR Foot 2 Views Left, XR Foot  2 Views Right  High risk medication use - [name] 40 mg sq every 14 days, MTX 0.7 ml once weekly, and folic acid 2 mg- Future order for TB gold placed today.  Standing orders for CBC and CMP are in place.  He will be due for lab work in April and every 3 months to monitor for drug toxicity.  We discussed scheduling a yearly skin exam since he is on [name].  He has not had any recent infections.- Plan: QuantiFERON-TB Gold Plus  Primary osteoarthritis of both hands: He has PIP and DIP synovial thickening consistent with osteoarthritis.  He has complete fist formation bilaterally.  He has no synovitis or tenderness on exam. Joint protection and muscle strengthening were discussed. X-rays of both hands were obtained today.  Erosive changes noted.   Primary osteoarthritis of both feet: He has been having increased discomfort in both feet.  X-rays of both feet were obtained today. Erosive changes noted.  Family history of heart disease   Orders: Orders Placed This Encounter  Procedures  . XR Hand 2 View Left  . XR Hand 2 View Right  . XR Foot 2 Views Left  . XR Foot 2 Views Right  . QuantiFERON-TB Gold Plus   Meds ordered this encounter  Medications  . Adalimumab ([name] PEN) 40  MG/0.4ML PNKT    Sig: Inject 40 mg into the skin every 14 (fourteen) days.    Dispense:  2 each    Refill:  0    Dose change.     Follow-Up Instructions: Return for Rheumatoid arthritis, Osteoarthritis.   Gearldine Bienenstockaylor M , PA-C  Note - This record has been created using Dragon software.  Chart creation errors have been sought, but may not always  have been located. Such creation errors do not reflect on  the standard of medical care.

## 2019-01-04 ENCOUNTER — Other Ambulatory Visit: Payer: Self-pay | Admitting: *Deleted

## 2019-01-04 MED ORDER — METHOTREXATE SODIUM CHEMO INJECTION 50 MG/2ML: 17.5000 mg | INTRAMUSCULAR | 0 refills | Status: DC

## 2019-01-04 NOTE — Telephone Encounter (Signed)
Last Visit: 08/10/18 Next visit: 01/10/18 Labs:11/17/18 WNL  Okay to refill per Dr. Corliss Skains

## 2019-01-11 ENCOUNTER — Ambulatory Visit (INDEPENDENT_AMBULATORY_CARE_PROVIDER_SITE_OTHER): Payer: Commercial Managed Care - PPO

## 2019-01-11 ENCOUNTER — Encounter: Payer: Self-pay | Admitting: Physician Assistant

## 2019-01-11 ENCOUNTER — Ambulatory Visit (INDEPENDENT_AMBULATORY_CARE_PROVIDER_SITE_OTHER): Payer: Self-pay

## 2019-01-11 ENCOUNTER — Ambulatory Visit (INDEPENDENT_AMBULATORY_CARE_PROVIDER_SITE_OTHER): Payer: Commercial Managed Care - PPO | Admitting: Physician Assistant

## 2019-01-11 ENCOUNTER — Encounter (INDEPENDENT_AMBULATORY_CARE_PROVIDER_SITE_OTHER): Payer: Self-pay

## 2019-01-11 VITALS — BP 106/70 | HR 66 | Resp 14 | Ht 70.0 in | Wt 192.2 lb

## 2019-01-11 DIAGNOSIS — M0579 Rheumatoid arthritis with rheumatoid factor of multiple sites without organ or systems involvement: Secondary | ICD-10-CM | POA: Diagnosis not present

## 2019-01-11 DIAGNOSIS — M19072 Primary osteoarthritis, left ankle and foot: Secondary | ICD-10-CM

## 2019-01-11 DIAGNOSIS — Z79899 Other long term (current) drug therapy: Secondary | ICD-10-CM | POA: Diagnosis not present

## 2019-01-11 DIAGNOSIS — M19041 Primary osteoarthritis, right hand: Secondary | ICD-10-CM | POA: Diagnosis not present

## 2019-01-11 DIAGNOSIS — M19071 Primary osteoarthritis, right ankle and foot: Secondary | ICD-10-CM | POA: Diagnosis not present

## 2019-01-11 DIAGNOSIS — M19042 Primary osteoarthritis, left hand: Secondary | ICD-10-CM

## 2019-01-11 DIAGNOSIS — Z8249 Family history of ischemic heart disease and other diseases of the circulatory system: Secondary | ICD-10-CM

## 2019-01-11 MED ORDER — ADALIMUMAB 40 MG/0.4ML ~~LOC~~ AJKT: 40.0000 mg | AUTO-INJECTOR | SUBCUTANEOUS | 0 refills | Status: DC

## 2019-01-11 NOTE — Patient Instructions (Signed)
Standing Labs We placed an order today for your standing lab work.    Please come back and get your standing labs in April and every 3 months  We have open lab Monday through Friday from 8:30-11:30 AM and 1:30-4:00 PM  at the office of Dr. Shaili Deveshwar.   You may experience shorter wait times on Monday and Friday afternoons. The office is located at [address], [address], [place], [place] [address] No appointment is necessary.   Labs are drawn by [facility].  You may receive a bill from [facility] for your lab work.  If you wish to have your labs drawn at another location, please call the office 24 hours in advance to send orders.  If you have any questions regarding directions or hours of operation,  please call [phone].   Just as a reminder please drink plenty of water prior to coming for your lab work. Thanks!  

## 2019-02-03 ENCOUNTER — Other Ambulatory Visit: Payer: Self-pay | Admitting: Rheumatology

## 2019-02-03 DIAGNOSIS — M0579 Rheumatoid arthritis with rheumatoid factor of multiple sites without organ or systems involvement: Secondary | ICD-10-CM

## 2019-02-04 NOTE — Telephone Encounter (Signed)
Last Visit: 01/11/19 Next visit: 07/14/19 Labs: 11/17/18 WNL TB Gold: 03/09/18 Neg   Okay to refill per Dr. Corliss Skains

## 2019-02-26 ENCOUNTER — Other Ambulatory Visit: Payer: Self-pay | Admitting: Rheumatology

## 2019-02-28 NOTE — Telephone Encounter (Signed)
Last Visit: 01/11/2019 Next Visit: 07/14/2019  Okay to refill per Dr. Corliss Skains.

## 2019-03-02 ENCOUNTER — Other Ambulatory Visit: Payer: Self-pay | Admitting: Rheumatology

## 2019-03-03 NOTE — Telephone Encounter (Signed)
Last Visit: 01/11/19 Next visit: 07/14/19 Labs: 11/17/18 WNL TB Gold: 03/09/18 Neg   Left message to advise patient he is due to update labs.   Okay to refill per Dr. Corliss Skains

## 2019-03-29 ENCOUNTER — Other Ambulatory Visit: Payer: Self-pay | Admitting: Rheumatology

## 2019-03-29 NOTE — Telephone Encounter (Signed)
Last Visit: 01/11/19 Next visit: 07/14/19 Labs: 11/17/18 WNL  Left message to advise patient he is due to update labs.   Okay to refill 30 day supply per Dr. Corliss Skains

## 2019-04-11 ENCOUNTER — Other Ambulatory Visit: Payer: Self-pay

## 2019-04-11 DIAGNOSIS — Z79899 Other long term (current) drug therapy: Secondary | ICD-10-CM

## 2019-04-13 LAB — QUANTIFERON-TB GOLD PLUS
Mitogen-NIL: 8.69 IU/mL
NIL: 0.02 IU/mL
QuantiFERON-TB Gold Plus: NEGATIVE
TB1-NIL: 0.01 IU/mL
TB2-NIL: 0 IU/mL

## 2019-04-13 LAB — CBC WITH DIFFERENTIAL/PLATELET
Absolute Monocytes: 760 cells/uL (ref 200–950)
Basophils Absolute: 68 cells/uL (ref 0–200)
Basophils Relative: 0.9 %
Eosinophils Absolute: 182 cells/uL (ref 15–500)
Eosinophils Relative: 2.4 %
HCT: 45.4 % (ref 38.5–50.0)
Hemoglobin: 15.3 g/dL (ref 13.2–17.1)
Lymphs Abs: 1672 cells/uL (ref 850–3900)
MCH: 30.1 pg (ref 27.0–33.0)
MCHC: 33.7 g/dL (ref 32.0–36.0)
MCV: 89.4 fL (ref 80.0–100.0)
MPV: 12.1 fL (ref 7.5–12.5)
Monocytes Relative: 10 %
Neutro Abs: 4917 cells/uL (ref 1500–7800)
Neutrophils Relative %: 64.7 %
Platelets: 227 10*3/uL (ref 140–400)
RBC: 5.08 10*6/uL (ref 4.20–5.80)
RDW: 12.9 % (ref 11.0–15.0)
Total Lymphocyte: 22 %
WBC: 7.6 10*3/uL (ref 3.8–10.8)

## 2019-04-13 LAB — COMPLETE METABOLIC PANEL WITH GFR
AG Ratio: 1.3 (calc) (ref 1.0–2.5)
ALT: 22 U/L (ref 9–46)
AST: 23 U/L (ref 10–35)
Albumin: 4 g/dL (ref 3.6–5.1)
Alkaline phosphatase (APISO): 74 U/L (ref 35–144)
BUN: 21 mg/dL (ref 7–25)
CO2: 26 mmol/L (ref 20–32)
Calcium: 9.3 mg/dL (ref 8.6–10.3)
Chloride: 108 mmol/L (ref 98–110)
Creat: 0.94 mg/dL (ref 0.70–1.33)
GFR, Est African American: 107 mL/min/{1.73_m2} (ref >=60)
GFR, Est Non African American: 92 mL/min/{1.73_m2} (ref >=60)
Globulin: 3.1 g/dL (calc) (ref 1.9–3.7)
Glucose, Bld: 109 mg/dL — ABNORMAL HIGH (ref 65–99)
Potassium: 3.8 mmol/L (ref 3.5–5.3)
Sodium: 142 mmol/L (ref 135–146)
Total Bilirubin: 0.9 mg/dL (ref 0.2–1.2)
Total Protein: 7.1 g/dL (ref 6.1–8.1)

## 2019-04-13 NOTE — Progress Notes (Signed)
TB gold negative

## 2019-04-28 ENCOUNTER — Other Ambulatory Visit: Payer: Self-pay | Admitting: Rheumatology

## 2019-04-28 NOTE — Telephone Encounter (Signed)
Last Visit: 01/11/19 Next visit: 07/14/19 Labs: 04/11/19 Glucose is 109. Rest of CMP. CBC WNL  Okay to refill per Dr. Estanislado Pandy

## 2019-05-27 ENCOUNTER — Other Ambulatory Visit: Payer: Self-pay | Admitting: Rheumatology

## 2019-06-30 NOTE — Progress Notes (Signed)
Office Visit Note  Patient: Ricky Hardin             Date of Birth: [birth year]           MRN: [id number]             PCP: Patient, No Pcp Per Referring: No ref. provider found Visit Date: 07/14/2019 Occupation: @GUAROCC @  Subjective:  Medication monitoring   History of Present Illness: LATONYA KNIGHT is a 53 y.o. male with history of seropositive rheumatoid arthritis and osteoarthritis.  He is injecting [name] 40 mg sq every 3 weeks and MTX 0.7 ml sq once weekly.  His most recent [name] injection was yesterday.  He experiences a mild headache the day after MTX injections.  He occasionally takes advil for symptomatic relief.   He denies any joint pain or joint swelling.  He denies any increased joint stiffness.  He has not had any recent flares.       Activities of Daily Living:  Patient reports morning stiffness for 0 minutes.   Patient Denies nocturnal pain.  Difficulty dressing/grooming: Denies Difficulty climbing stairs: Denies Difficulty getting out of chair: Denies Difficulty using hands for taps, buttons, cutlery, and/or writing: Denies  Review of Systems  Constitutional: Negative for fatigue.  HENT: Negative for mouth sores, mouth dryness and nose dryness.   Eyes: Negative for pain, itching and dryness.  Respiratory: Negative for shortness of breath, wheezing and difficulty breathing.   Cardiovascular: Negative for chest pain and palpitations.  Gastrointestinal: Negative for blood in stool, constipation, diarrhea and nausea.  Endocrine: Negative for increased urination.  Genitourinary: Negative for difficulty urinating and painful urination.  Musculoskeletal: Negative for arthralgias, joint pain, joint swelling and morning stiffness.  Skin: Negative for rash and redness.  Allergic/Immunologic: Negative for susceptible to infections.  Neurological: Positive for headaches. Negative for dizziness, light-headedness, memory loss and weakness.  Hematological: Negative for  bruising/bleeding tendency.  Psychiatric/Behavioral: Negative for confusion and sleep disturbance.    PMFS History:  Patient Active Problem List   Diagnosis Date Noted  . RA (rheumatoid arthritis) (HCC) 09/19/2016  . Osteoarthritis of both hands 09/19/2016  . Osteoarthritis of both feet 09/19/2016    Past Medical History:  Diagnosis Date  . Osteoarthritis of both feet 09/19/2016  . Osteoarthritis of both hands 09/19/2016  . RA (rheumatoid arthritis) (HCC) 09/19/2016   +RF, +CCP Erosive disease    Family History  Problem Relation Age of Onset  . Heart disease Father 66  . Rheum arthritis Sister    History reviewed. No pertinent surgical history. Social History   Social History Narrative  . Not on file    There is no immunization history on file for this patient.   Objective: Vital Signs: BP 119/81 (BP Location: Left Arm, Patient Position: Sitting, Cuff Size: Normal)   Pulse 72   Resp 15   Ht 5\' 10"  (1.778 m)   Wt 195 lb 9.6 oz (88.7 kg)   BMI 28.07 kg/m    Physical Exam Vitals signs and nursing note reviewed.  Constitutional:      Appearance: He is well-developed.  HENT:     Head: Normocephalic and atraumatic.  Eyes:     Conjunctiva/sclera: Conjunctivae normal.     Pupils: Pupils are equal, round, and reactive to light.  Neck:     Musculoskeletal: Normal range of motion and neck supple.  Cardiovascular:     Rate and Rhythm: Normal rate and regular rhythm.     Heart sounds:  Normal heart sounds.  Pulmonary:     Effort: Pulmonary effort is normal.     Breath sounds: Normal breath sounds.  Abdominal:     General: Bowel sounds are normal.     Palpations: Abdomen is soft.  Skin:    General: Skin is warm and dry.     Capillary Refill: Capillary refill takes less than 2 seconds.  Neurological:     Mental Status: He is alert and oriented to person, place, and time.  Psychiatric:        Behavior: Behavior normal.      Musculoskeletal Exam: C-spine, thoracic  spine, lumbar spine good range of motion.  No midline spinal tenderness.  No SI joint tenderness.  Shoulder joints, elbow joints, wrist joints, MCPs, PIPs and DIPs good range of motion no synovitis.  He has complete fist formation bilaterally.  PIP and DIP synovial thickening consistent with osteoarthritis of bilateral hands.  Hip joints, knee joints, ankle joints, MTPs, PIPs, DIPs good range of motion no synovitis.  No warmth or effusion of bilateral knee joints.  No tenderness or swelling of ankle joints.  CDAI Exam: CDAI Score: 0.2  Patient Global: 1 mm; Provider Global: 1 mm Swollen: 0 ; Tender: 0  Joint Exam   No joint exam has been documented for this visit   There is currently no information documented on the homunculus. Go to the Rheumatology activity and complete the homunculus joint exam.  Investigation: No additional findings.  Imaging: No results found.  Recent Labs: Lab Results  Component Value Date   WBC 7.6 04/11/2019   HGB 15.3 04/11/2019   PLT 227 04/11/2019   NA 142 04/11/2019   K 3.8 04/11/2019   CL 108 04/11/2019   CO2 26 04/11/2019   GLUCOSE 109 (H) 04/11/2019   BUN 21 04/11/2019   CREATININE 0.94 04/11/2019   BILITOT 0.9 04/11/2019   ALKPHOS 83 05/05/2017   AST 23 04/11/2019   ALT 22 04/11/2019   PROT 7.1 04/11/2019   ALBUMIN 4.3 05/05/2017   CALCIUM 9.3 04/11/2019   GFRAA 107 04/11/2019   QFTBGOLDPLUS NEGATIVE 04/11/2019    Speciality Comments: No specialty comments available.  Procedures:  No procedures performed Allergies: Patient has no known allergies.   Assessment / Plan:     Visit Diagnoses: Rheumatoid arthritis involving multiple sites with positive rheumatoid factor (HCC): He has no synovitis on exam.  He has not had any recent rheumatoid arthritis flares.  He has been spacing [name] to 40 mg every 21 days and injecting methotrexate 0.7 mL once weekly.  He experiences headaches the day after his methotrexate injections and occasionally  has to take Advil for symptomatic relief.  We discussed reducing the dose of methotrexate to 0.5 mg once weekly, but he would like to continue injecting MTX 0.7 ml sq once weekly.  He has not had any recent flares.  He has no joint pain or joint swelling at this time.  He has not been experiencing any morning stiffness.  He was advised to notify us if he develops increased joint pain or joint swelling.  He will follow-up in the office in 5 months  High risk medication use -  [name] 40 mg sq every 21 days, MTX 0.7 ml once weekly, and folic acid 2 mg.  CBC and CMP were drawn on 04/11/2019.  TB gold was negative on 04/11/2019.  CBC and CMP will be drawn today to monitor for drug toxicity.  He will return in December and  every 3 months for lab work.  He was advised to hold [name] and methotrexate if he develops any signs or symptoms of an infection and to resume once the infection is completely cleared.  We discussed the importance of following standard precautions recommended by the CDC.  I also discussed the importance of having yearly skin exams while on [name] due to the increased risk of melanoma.- Plan: CBC with Differential/Platelet, COMPLETE METABOLIC PANEL WITH GFR  Primary osteoarthritis of both hands: He has PIP and DIP synovial thickening consistent with osteoarthritis of bilateral hands.  He has complete fist formation bilaterally.  Joint protection and muscle strengthening were discussed.  Primary osteoarthritis of both feet: He has no feet pain at this time.  He wears proper fitting shoes.  Family history of heart disease  Orders: Orders Placed This Encounter  Procedures  . CBC with Differential/Platelet  . COMPLETE METABOLIC PANEL WITH GFR   No orders of the defined types were placed in this encounter.    Follow-Up Instructions: Return in about 5 months (around 12/14/2019) for Rheumatoid arthritis, Osteoarthritis.   Ofilia Neas, PA-C  Note - This record has been created using Dragon  software.  Chart creation errors have been sought, but may not always  have been located. Such creation errors do not reflect on  the standard of medical care.

## 2019-07-06 ENCOUNTER — Other Ambulatory Visit: Payer: Self-pay | Admitting: Rheumatology

## 2019-07-12 ENCOUNTER — Other Ambulatory Visit: Payer: Self-pay | Admitting: Rheumatology

## 2019-07-12 DIAGNOSIS — M0579 Rheumatoid arthritis with rheumatoid factor of multiple sites without organ or systems involvement: Secondary | ICD-10-CM

## 2019-07-12 NOTE — Telephone Encounter (Signed)
Last Visit: 01/28/19  Next Visit: 07/14/19 Labs: 07/12/19 Glucose is 109. Rest of CMP. CBC WNL Labs: 04/11/19 Neg   Okay to refill per Dr. Estanislado Pandy

## 2019-07-14 ENCOUNTER — Other Ambulatory Visit: Payer: Self-pay

## 2019-07-14 ENCOUNTER — Encounter: Payer: Self-pay | Admitting: Physician Assistant

## 2019-07-14 ENCOUNTER — Ambulatory Visit (INDEPENDENT_AMBULATORY_CARE_PROVIDER_SITE_OTHER): Payer: Commercial Managed Care - PPO | Admitting: Physician Assistant

## 2019-07-14 VITALS — BP 119/81 | HR 72 | Resp 15 | Ht 70.0 in | Wt 195.6 lb

## 2019-07-14 DIAGNOSIS — Z79899 Other long term (current) drug therapy: Secondary | ICD-10-CM | POA: Diagnosis not present

## 2019-07-14 DIAGNOSIS — M0579 Rheumatoid arthritis with rheumatoid factor of multiple sites without organ or systems involvement: Secondary | ICD-10-CM | POA: Diagnosis not present

## 2019-07-14 DIAGNOSIS — M19041 Primary osteoarthritis, right hand: Secondary | ICD-10-CM | POA: Diagnosis not present

## 2019-07-14 DIAGNOSIS — Z8249 Family history of ischemic heart disease and other diseases of the circulatory system: Secondary | ICD-10-CM

## 2019-07-14 DIAGNOSIS — M19072 Primary osteoarthritis, left ankle and foot: Secondary | ICD-10-CM

## 2019-07-14 DIAGNOSIS — M19042 Primary osteoarthritis, left hand: Secondary | ICD-10-CM

## 2019-07-14 DIAGNOSIS — M19071 Primary osteoarthritis, right ankle and foot: Secondary | ICD-10-CM

## 2019-07-14 NOTE — Patient Instructions (Signed)
Standing Labs We placed an order today for your standing lab work.    Please come back and get your standing labs in December and every 3 months  We have open lab daily Monday through Thursday from 8:30-12:30 PM and 1:30-4:30 PM and Friday from 8:30-12:30 PM and 1:30 -4:00 PM at the office of Dr. Shaili Deveshwar.   You may experience shorter wait times on Monday and Friday afternoons. The office is located at [address], [address], [place], [place] [address] No appointment is necessary.   Labs are drawn by [facility].  You may receive a bill from [facility] for your lab work.  If you wish to have your labs drawn at another location, please call the office 24 hours in advance to send orders.  If you have any questions regarding directions or hours of operation,  please call [phone].   Just as a reminder please drink plenty of water prior to coming for your lab work. Thanks!  

## 2019-07-15 LAB — COMPLETE METABOLIC PANEL WITH GFR
AG Ratio: 1.5 (calc) (ref 1.0–2.5)
ALT: 31 U/L (ref 9–46)
AST: 27 U/L (ref 10–35)
Albumin: 4.3 g/dL (ref 3.6–5.1)
Alkaline phosphatase (APISO): 75 U/L (ref 35–144)
BUN: 21 mg/dL (ref 7–25)
CO2: 22 mmol/L (ref 20–32)
Calcium: 9.3 mg/dL (ref 8.6–10.3)
Chloride: 106 mmol/L (ref 98–110)
Creat: 0.85 mg/dL (ref 0.70–1.33)
GFR, Est African American: 115 mL/min/{1.73_m2} (ref >=60)
GFR, Est Non African American: 99 mL/min/{1.73_m2} (ref >=60)
Globulin: 2.9 g/dL (calc) (ref 1.9–3.7)
Glucose, Bld: 91 mg/dL (ref 65–99)
Potassium: 4.3 mmol/L (ref 3.5–5.3)
Sodium: 139 mmol/L (ref 135–146)
Total Bilirubin: 0.9 mg/dL (ref 0.2–1.2)
Total Protein: 7.2 g/dL (ref 6.1–8.1)

## 2019-07-15 LAB — CBC WITH DIFFERENTIAL/PLATELET
Absolute Monocytes: 796 cells/uL (ref 200–950)
Basophils Absolute: 58 cells/uL (ref 0–200)
Basophils Relative: 0.8 %
Eosinophils Absolute: 212 cells/uL (ref 15–500)
Eosinophils Relative: 2.9 %
HCT: 44.5 % (ref 38.5–50.0)
Hemoglobin: 15.3 g/dL (ref 13.2–17.1)
Lymphs Abs: 1986 cells/uL (ref 850–3900)
MCH: 30.8 pg (ref 27.0–33.0)
MCHC: 34.4 g/dL (ref 32.0–36.0)
MCV: 89.7 fL (ref 80.0–100.0)
MPV: 12.1 fL (ref 7.5–12.5)
Monocytes Relative: 10.9 %
Neutro Abs: 4249 cells/uL (ref 1500–7800)
Neutrophils Relative %: 58.2 %
Platelets: 238 10*3/uL (ref 140–400)
RBC: 4.96 10*6/uL (ref 4.20–5.80)
RDW: 13 % (ref 11.0–15.0)
Total Lymphocyte: 27.2 %
WBC: 7.3 10*3/uL (ref 3.8–10.8)

## 2019-07-15 NOTE — Progress Notes (Signed)
CBC and CMP WNL

## 2019-07-20 ENCOUNTER — Other Ambulatory Visit: Payer: Self-pay | Admitting: Rheumatology

## 2019-07-21 ENCOUNTER — Telehealth: Payer: Self-pay | Admitting: Rheumatology

## 2019-07-21 NOTE — Telephone Encounter (Signed)
Patient left a voicemail requesting prescription refill of Methotrexate.  Patient states the pharmacy needs a new prescription.  Patient states he needs to pick up the prescription today and is requesting a return call.

## 2019-07-21 NOTE — Telephone Encounter (Signed)
Last Visit: 07/14/19 Next Visit: 12/15/19 Labs: 07/14/19 WNL  Okay to refill per Dr. Estanislado Pandy

## 2019-07-21 NOTE — Telephone Encounter (Signed)
Attempted to contact patient and left message to advise patient prescription has been sent to the pharmacy.

## 2019-08-05 ENCOUNTER — Telehealth: Payer: Self-pay | Admitting: *Deleted

## 2019-08-05 NOTE — Telephone Encounter (Signed)
Received a fax stating a prior authorization is needed for [name] CF 40 mg/0.4 mL pen

## 2019-08-08 NOTE — Telephone Encounter (Signed)
Submitted a Prior Authorization request to [employer] for [name] via Cover My Meds. Will update once we receive a response.

## 2019-08-13 ENCOUNTER — Other Ambulatory Visit: Payer: Self-pay | Admitting: Rheumatology

## 2019-08-15 NOTE — Telephone Encounter (Signed)
Called [employer] to follow up on [name] PA. Rep Kenney Houseman advised they needed chart notes faxed proving patient. Faxed requested notes, will follow up on status.  Phone# [phone]  2:01 PM Beatriz Chancellor, CPhT

## 2019-08-16 ENCOUNTER — Telehealth: Payer: Self-pay | Admitting: Rheumatology

## 2019-08-16 NOTE — Telephone Encounter (Signed)
Christine from [facility] left a voicemail requested a return call regarding patient's prior authorization for [name] Pen 40 mg citrate free.  Please call [phone]

## 2019-08-16 NOTE — Telephone Encounter (Signed)
Returned call back to [place] at [employer], she just wanted to confirm the patient was using the citrate free formulation.Confirmed that he is. She had no other questions. Office will receive fax once review has been complete.  3:18 PM Beatriz Chancellor, CPhT

## 2019-08-17 NOTE — Telephone Encounter (Signed)
Received notification from [facility] regarding a prior authorization for [place]. Authorization has been APPROVED from 09/02/2019 to 08/31/2020.   Will send document to scan center.  Authorization # [id number]

## 2019-08-30 ENCOUNTER — Other Ambulatory Visit: Payer: Self-pay | Admitting: Rheumatology

## 2019-08-30 NOTE — Telephone Encounter (Signed)
Last Visit: 07/14/19 Next Visit: 12/15/19 Labs: 07/14/19 WNL  Okay to refill per Dr. Estanislado Pandy

## 2019-09-27 ENCOUNTER — Other Ambulatory Visit: Payer: Self-pay | Admitting: Rheumatology

## 2019-09-27 NOTE — Telephone Encounter (Signed)
Last Visit: 07/14/19 Next Visit: 12/15/19 Labs: 07/14/19 WNL  Okay to refill per Dr. Deveshwar  

## 2019-10-10 ENCOUNTER — Other Ambulatory Visit: Payer: Self-pay | Admitting: Rheumatology

## 2019-10-11 NOTE — Telephone Encounter (Signed)
Last Visit: 07/14/2019 Next Visit: 12/15/2019  Okay to refill per Dr. Estanislado Pandy.

## 2019-10-31 ENCOUNTER — Other Ambulatory Visit: Payer: Self-pay | Admitting: Rheumatology

## 2019-10-31 DIAGNOSIS — Z79899 Other long term (current) drug therapy: Secondary | ICD-10-CM

## 2019-10-31 NOTE — Telephone Encounter (Signed)
Last Visit: 07/14/2019 Next Visit: 12/15/2019 Labs: 07/14/2019 CBC and CMP WNL.  Advised patient he is due to update labs, patient verbalized understanding and will go this week. Orders released for [facility].   Okay to refill per Dr. Estanislado Pandy.

## 2019-11-03 LAB — CBC WITH DIFFERENTIAL/PLATELET
Absolute Monocytes: 758 cells/uL (ref 200–950)
Basophils Absolute: 60 cells/uL (ref 0–200)
Basophils Relative: 0.8 %
Eosinophils Absolute: 210 cells/uL (ref 15–500)
Eosinophils Relative: 2.8 %
HCT: 45.8 % (ref 38.5–50.0)
Hemoglobin: 15.5 g/dL (ref 13.2–17.1)
Lymphs Abs: 1568 cells/uL (ref 850–3900)
MCH: 30.9 pg (ref 27.0–33.0)
MCHC: 33.8 g/dL (ref 32.0–36.0)
MCV: 91.2 fL (ref 80.0–100.0)
MPV: 12.8 fL — ABNORMAL HIGH (ref 7.5–12.5)
Monocytes Relative: 10.1 %
Neutro Abs: 4905 cells/uL (ref 1500–7800)
Neutrophils Relative %: 65.4 %
Platelets: 228 10*3/uL (ref 140–400)
RBC: 5.02 10*6/uL (ref 4.20–5.80)
RDW: 13.1 % (ref 11.0–15.0)
Total Lymphocyte: 20.9 %
WBC: 7.5 10*3/uL (ref 3.8–10.8)

## 2019-11-03 LAB — COMPLETE METABOLIC PANEL WITH GFR
AG Ratio: 1.4 (calc) (ref 1.0–2.5)
ALT: 43 U/L (ref 9–46)
AST: 30 U/L (ref 10–35)
Albumin: 4.1 g/dL (ref 3.6–5.1)
Alkaline phosphatase (APISO): 86 U/L (ref 35–144)
BUN: 15 mg/dL (ref 7–25)
CO2: 23 mmol/L (ref 20–32)
Calcium: 9.2 mg/dL (ref 8.6–10.3)
Chloride: 106 mmol/L (ref 98–110)
Creat: 0.99 mg/dL (ref 0.70–1.33)
GFR, Est African American: 100 mL/min/{1.73_m2} (ref >=60)
GFR, Est Non African American: 86 mL/min/{1.73_m2} (ref >=60)
Globulin: 2.9 g/dL (calc) (ref 1.9–3.7)
Glucose, Bld: 79 mg/dL (ref 65–99)
Potassium: 4.2 mmol/L (ref 3.5–5.3)
Sodium: 140 mmol/L (ref 135–146)
Total Bilirubin: 0.9 mg/dL (ref 0.2–1.2)
Total Protein: 7 g/dL (ref 6.1–8.1)

## 2019-11-07 NOTE — Telephone Encounter (Signed)
CBC and CMP are within normal limits.

## 2019-11-28 ENCOUNTER — Encounter: Payer: Self-pay | Admitting: Internal Medicine

## 2019-12-09 NOTE — Progress Notes (Signed)
Office Visit Note  Patient: Ricky Hardin             Date of Birth: 02/20/1966           MRN: [id number]             PCP: Patient, No Pcp Per Referring: No ref. provider found Visit Date: 12/15/2019 Occupation: @GUAROCC @  Subjective:  Discuss medications   History of Present Illness: Ricky Hardin is a 54 y.o. male with history of seropositive rheumatoid arthritis and osteoarthritis. He has been injecting [name] 40 mg sq every 21 days, methotrexate 0.7 mg once weekly, and folic acid 2 mg by mouth daily. He states that he has been experiencing increased joint pain and stiffness since spacing his dose of [name]. He states he has had intermittent pain in the right shoulder joint, left wrist joint, and both feet lasting about 3 to 4 days at a time. He denies any obvious joint swelling. He denies any lower back pain at this time. He states he continues to play tennis several days per week for exercise. He is planning on obtaining the Covid 19 vaccine once it is available to him.   Activities of Daily Living:  Patient reports morning stiffness for 0 none.   Patient Denies nocturnal pain.  Difficulty dressing/grooming: Denies Difficulty climbing stairs: Denies Difficulty getting out of chair: Denies Difficulty using hands for taps, buttons, cutlery, and/or writing: Denies  Review of Systems  Constitutional: Negative for fatigue and night sweats.  HENT: Negative for mouth sores, mouth dryness and nose dryness.   Eyes: Negative for redness and dryness.  Respiratory: Negative for cough, hemoptysis, shortness of breath and difficulty breathing.   Cardiovascular: Negative for chest pain, palpitations, hypertension, irregular heartbeat and swelling in legs/feet.  Gastrointestinal: Negative for constipation and diarrhea.  Endocrine: Negative for excessive thirst and increased urination.  Genitourinary: Negative for difficulty urinating and painful urination.  Musculoskeletal: Positive for  arthralgias and joint pain. Negative for joint swelling, myalgias, muscle weakness, morning stiffness, muscle tenderness and myalgias.  Skin: Negative for color change, rash, hair loss, nodules/bumps, skin tightness, ulcers and sensitivity to sunlight.  Allergic/Immunologic: Negative for susceptible to infections.  Neurological: Negative for dizziness, fainting, memory loss, night sweats and weakness.  Hematological: Negative for bruising/bleeding tendency and swollen glands.  Psychiatric/Behavioral: Negative for depressed mood and sleep disturbance. The patient is not nervous/anxious.     PMFS History:  Patient Active Problem List   Diagnosis Date Noted  . RA (rheumatoid arthritis) (HCC) 09/19/2016  . Osteoarthritis of both hands 09/19/2016  . Osteoarthritis of both feet 09/19/2016    Past Medical History:  Diagnosis Date  . Osteoarthritis of both feet 09/19/2016  . Osteoarthritis of both hands 09/19/2016  . RA (rheumatoid arthritis) (HCC) 09/19/2016   +RF, +CCP Erosive disease    Family History  Problem Relation Age of Onset  . Heart disease Father 49  . Rheum arthritis Sister    History reviewed. No pertinent surgical history. Social History   Social History Narrative  . Not on file    There is no immunization history on file for this patient.   Objective: Vital Signs: BP 130/83 (BP Location: Left Arm, Patient Position: Sitting, Cuff Size: Normal)   Pulse 69   Resp 16   Ht 5\' 10"  (1.778 m)   Wt 195 lb 3.2 oz (88.5 kg)   BMI 28.01 kg/m    Physical Exam Vitals and nursing note reviewed.  Constitutional:  Appearance: He is well-developed.  HENT:     Head: Normocephalic and atraumatic.  Eyes:     Conjunctiva/sclera: Conjunctivae normal.     Pupils: Pupils are equal, round, and reactive to light.  Cardiovascular:     Rate and Rhythm: Normal rate and regular rhythm.     Heart sounds: Normal heart sounds.  Pulmonary:     Effort: Pulmonary effort is normal.      Breath sounds: Normal breath sounds.  Abdominal:     General: Bowel sounds are normal.     Palpations: Abdomen is soft.  Musculoskeletal:     Cervical back: Normal range of motion and neck supple.  Skin:    General: Skin is warm and dry.     Capillary Refill: Capillary refill takes less than 2 seconds.  Neurological:     Mental Status: He is alert and oriented to person, place, and time.  Psychiatric:        Behavior: Behavior normal.      Musculoskeletal Exam: C-spine, thoracic spine, lumbar spine good range of motion. No midline spinal tenderness. No SI joint tenderness. Shoulder joints, elbow joints, wrist joints, MCPs, PIPs and DIPs good range of motion with no synovitis. PIP and DIP synovial thickening consistent with osteoarthritis of both hands. He has complete fist formation bilaterally. Hip joints, knee joints, ankle joints, MTPs, PIPs, DIPs good range of motion with no synovitis. No warmth or effusion of bilateral knee joints. No tenderness or swelling of ankle joints.  CDAI Exam: CDAI Score: 0.2  Patient Global: 1 mm; Provider Global: 1 mm Swollen: 0 ; Tender: 0  Joint Exam 12/15/2019   No joint exam has been documented for this visit   There is currently no information documented on the homunculus. Go to the Rheumatology activity and complete the homunculus joint exam.  Investigation: No additional findings.  Imaging: No results found.  Recent Labs: Lab Results  Component Value Date   WBC 7.5 11/02/2019   HGB 15.5 11/02/2019   PLT 228 11/02/2019   NA 140 11/02/2019   K 4.2 11/02/2019   CL 106 11/02/2019   CO2 23 11/02/2019   GLUCOSE 79 11/02/2019   BUN 15 11/02/2019   CREATININE 0.99 11/02/2019   BILITOT 0.9 11/02/2019   ALKPHOS 83 05/05/2017   AST 30 11/02/2019   ALT 43 11/02/2019   PROT 7.0 11/02/2019   ALBUMIN 4.3 05/05/2017   CALCIUM 9.2 11/02/2019   GFRAA 100 11/02/2019   QFTBGOLDPLUS NEGATIVE 04/11/2019    Speciality Comments: No specialty  comments available.  Procedures:  No procedures performed Allergies: Patient has no known allergies.   Assessment / Plan:     Visit Diagnoses: Rheumatoid arthritis involving multiple sites with positive rheumatoid factor ([place]): He has no synovitis on exam today. He has been experiencing more frequent rheumatoid arthritis flares. He has had intermittent episodes of right shoulder joint pain, left wrist joint pain, and pain in both feet. He has not noticed any obvious joint swelling during these episodes but he does experience tenderness and discomfort for about 3 to 4 days. He has been injecting [name] 40 mg subcutaneously every 21 days, methotrexate 0.7 mL once weekly, and folic acid 2 mg by mouth daily. He would like to resume the increased frequency of [name] 40 mg of days injections every 14 days and continue on methotrexate as prescribed. A prescription of [name] will be sent to the pharmacy. He was advised to notify us if he develops increased joint pain  or joint swelling. He will follow-up in the office in 4 months.  High risk medication use - [name] 40 mg sq every 21 days, MTX 0.7 ml once weekly, and folic acid 2 mg. CBC and CMP were drawn on 11/02/19. Standing orders are in place. TB gold negative on 04/11/19. He has not had any recent infections. He plans on receiving the COVID-19 vaccination once it is available to him.  Primary osteoarthritis of both hands: He has PIP and DIP synovial thickening consistent with osteoarthritis of both hands. He has no tenderness or synovitis on exam today. He has complete fist formation bilaterally. Joint protection and muscle strengthening were discussed.  Primary osteoarthritis of both feet: He has been experiencing intermittent pain in both feet. No joint swelling. He has no tenderness on exam today. He was proper fitting shoes.  Chronic midline low back pain with left-sided sciatica: Resolved   Family history of heart disease    Orders: No orders of  the defined types were placed in this encounter.  No orders of the defined types were placed in this encounter.   Face-to-face time spent with patient was 30 minutes. Greater than 50% of time was spent in counseling and coordination of care.  Follow-Up Instructions: Return in about 4 months (around 04/13/2020) for Rheumatoid arthritis, Osteoarthritis.   Gearldine Bienenstock, PA-C  Note - This record has been created using Dragon software.  Chart creation errors have been sought, but may not always  have been located. Such creation errors do not reflect on  the standard of medical care.

## 2019-12-15 ENCOUNTER — Encounter (INDEPENDENT_AMBULATORY_CARE_PROVIDER_SITE_OTHER): Payer: Self-pay

## 2019-12-15 ENCOUNTER — Ambulatory Visit (AMBULATORY_SURGERY_CENTER): Payer: Self-pay | Admitting: *Deleted

## 2019-12-15 ENCOUNTER — Ambulatory Visit (INDEPENDENT_AMBULATORY_CARE_PROVIDER_SITE_OTHER): Payer: Commercial Managed Care - PPO | Admitting: Physician Assistant

## 2019-12-15 ENCOUNTER — Ambulatory Visit: Payer: Commercial Managed Care - PPO | Admitting: Physician Assistant

## 2019-12-15 ENCOUNTER — Other Ambulatory Visit: Payer: Self-pay

## 2019-12-15 ENCOUNTER — Encounter: Payer: Self-pay | Admitting: Physician Assistant

## 2019-12-15 VITALS — BP 130/83 | HR 69 | Resp 16 | Ht 70.0 in | Wt 195.2 lb

## 2019-12-15 VITALS — Temp 97.6°F | Ht 70.0 in | Wt 197.0 lb

## 2019-12-15 DIAGNOSIS — M0579 Rheumatoid arthritis with rheumatoid factor of multiple sites without organ or systems involvement: Secondary | ICD-10-CM

## 2019-12-15 DIAGNOSIS — M5442 Lumbago with sciatica, left side: Secondary | ICD-10-CM

## 2019-12-15 DIAGNOSIS — G8929 Other chronic pain: Secondary | ICD-10-CM

## 2019-12-15 DIAGNOSIS — Z79899 Other long term (current) drug therapy: Secondary | ICD-10-CM

## 2019-12-15 DIAGNOSIS — Z1211 Encounter for screening for malignant neoplasm of colon: Secondary | ICD-10-CM

## 2019-12-15 DIAGNOSIS — M19072 Primary osteoarthritis, left ankle and foot: Secondary | ICD-10-CM

## 2019-12-15 DIAGNOSIS — Z01818 Encounter for other preprocedural examination: Secondary | ICD-10-CM

## 2019-12-15 DIAGNOSIS — M19071 Primary osteoarthritis, right ankle and foot: Secondary | ICD-10-CM

## 2019-12-15 DIAGNOSIS — M19041 Primary osteoarthritis, right hand: Secondary | ICD-10-CM

## 2019-12-15 DIAGNOSIS — Z8249 Family history of ischemic heart disease and other diseases of the circulatory system: Secondary | ICD-10-CM

## 2019-12-15 DIAGNOSIS — M19042 Primary osteoarthritis, left hand: Secondary | ICD-10-CM

## 2019-12-15 MED ORDER — NA SULFATE-K SULFATE-MG SULF 17.5-3.13-1.6 GM/177ML PO SOLN: 1.0000 | Freq: Once | ORAL | 0 refills | Status: AC

## 2019-12-15 NOTE — Progress Notes (Signed)

## 2019-12-19 ENCOUNTER — Other Ambulatory Visit: Payer: Self-pay | Admitting: Rheumatology

## 2019-12-19 NOTE — Telephone Encounter (Signed)
Last Visit: 12/15/2019 Next Visit: 04/17/2020 Labs: 11/02/2019 CBC and CMP are within normal limits.  Okay to refill per Dr. Corliss Skains.

## 2019-12-21 ENCOUNTER — Other Ambulatory Visit: Payer: Self-pay | Admitting: Rheumatology

## 2019-12-21 DIAGNOSIS — M0579 Rheumatoid arthritis with rheumatoid factor of multiple sites without organ or systems involvement: Secondary | ICD-10-CM

## 2019-12-21 NOTE — Telephone Encounter (Signed)
Last Visit: 12/15/2019 Next Visit: 04/17/2020 Labs: 11/02/2019 CBC and CMP are within normal limits. TB Gold: 04/11/19 Neg   Okay to refill per Dr. Corliss Skains

## 2019-12-26 ENCOUNTER — Other Ambulatory Visit: Payer: Self-pay | Admitting: Internal Medicine

## 2019-12-27 LAB — SARS CORONAVIRUS 2 (TAT 6-24 HRS): SARS Coronavirus 2: NEGATIVE

## 2019-12-29 ENCOUNTER — Encounter: Payer: Self-pay | Admitting: Internal Medicine

## 2019-12-29 ENCOUNTER — Encounter: Payer: Commercial Managed Care - PPO | Admitting: Internal Medicine

## 2020-01-02 ENCOUNTER — Other Ambulatory Visit: Payer: Self-pay

## 2020-01-02 ENCOUNTER — Encounter: Payer: Self-pay | Admitting: Internal Medicine

## 2020-01-02 ENCOUNTER — Ambulatory Visit (AMBULATORY_SURGERY_CENTER): Payer: Commercial Managed Care - PPO | Admitting: Internal Medicine

## 2020-01-02 VITALS — BP 128/84 | HR 70 | Temp 96.6°F | Resp 12 | Ht 70.0 in | Wt 197.0 lb

## 2020-01-02 DIAGNOSIS — Z1211 Encounter for screening for malignant neoplasm of colon: Secondary | ICD-10-CM

## 2020-01-02 MED ORDER — SODIUM CHLORIDE 0.9 % IV SOLN: 500.0000 mL | Freq: Once | INTRAVENOUS | Status: DC

## 2020-01-02 NOTE — Progress Notes (Signed)
Pt's states no medical or surgical changes since previsit or office visit. Temp by JR. VS by CW.

## 2020-01-02 NOTE — Op Note (Signed)
[facility] Patient Name: Ricky Hardin Procedure Date: 01/02/2020 10:15 AM MRN: [id number] Endoscopist: Wilhemina Bonito. Marina Goodell , MD Age: 54 Referring MD:  Date of Birth: Dec 22, 1965 Gender: Male Account #: [id number] Procedure:                Colonoscopy Indications:              Screening for colorectal malignant neoplasm Medicines:                Monitored Anesthesia Care Procedure:                Pre-Anesthesia Assessment:                           - Prior to the procedure, a History and Physical                            was performed, and patient medications and                            allergies were reviewed. The patient's tolerance of                            previous anesthesia was also reviewed. The risks                            and benefits of the procedure and the sedation                            options and risks were discussed with the patient.                            All questions were answered, and informed consent                            was obtained. Prior Anticoagulants: The patient has                            taken no previous anticoagulant or antiplatelet                            agents. ASA Grade Assessment: II - A patient with                            mild systemic disease. After reviewing the risks                            and benefits, the patient was deemed in                            satisfactory condition to undergo the procedure.                           After obtaining informed consent, the colonoscope  was passed under direct vision. Throughout the                            procedure, the patient's blood pressure, pulse, and                            oxygen saturations were monitored continuously. The                            Colonoscope was introduced through the anus and                            advanced to the the cecum, identified by                            appendiceal orifice and  ileocecal valve. The                            ileocecal valve, appendiceal orifice, and rectum                            were photographed. The quality of the bowel                            preparation was excellent. The colonoscopy was                            performed without difficulty. The patient tolerated                            the procedure well. The bowel preparation used was                            SUPREP via split dose instruction. Scope In: 10:24:31 AM Scope Out: 10:35:39 AM Scope Withdrawal Time: 0 hours 9 minutes 18 seconds  Total Procedure Duration: 0 hours 11 minutes 8 seconds  Findings:                 A few small-mouthed diverticula were found in the                            left colon.                           Internal hemorrhoids were found during                            retroflexion. The hemorrhoids were small.                           The exam was otherwise without abnormality on                            direct and retroflexion views. Complications:            No immediate complications.  Estimated blood loss:                            None. Estimated Blood Loss:     Estimated blood loss: none. Impression:               - Diverticulosis in the left colon.                           - Internal hemorrhoids.                           - The examination was otherwise normal on direct                            and retroflexion views.                           - No specimens collected. Recommendation:           - Repeat colonoscopy in 10 years for screening                            purposes.                           - Patient has a contact number available for                            emergencies. The signs and symptoms of potential                            delayed complications were discussed with the                            patient. Return to normal activities tomorrow.                            Written discharge instructions were  provided to the                            patient.                           - Resume previous diet.                           - Continue present medications. Wilhemina Bonito. Marina Goodell, MD 01/02/2020 10:39:48 AM This report has been signed electronically.

## 2020-01-02 NOTE — Patient Instructions (Signed)
   Information on diverticulosis & hemorrhoids given to you today   YOU HAD AN ENDOSCOPIC PROCEDURE TODAY AT THE [facility]:   Refer to the procedure report that was given to you for any specific questions about what was found during the examination.  If the procedure report does not answer your questions, please call your gastroenterologist to clarify.  If you requested that your care partner not be given the details of your procedure findings, then the procedure report has been included in a sealed envelope for you to review at your convenience later.  YOU SHOULD EXPECT: Some feelings of bloating in the abdomen. Passage of more gas than usual.  Walking can help get rid of the air that was put into your GI tract during the procedure and reduce the bloating. If you had a lower endoscopy (such as a colonoscopy or flexible sigmoidoscopy) you may notice spotting of blood in your stool or on the toilet paper. If you underwent a bowel prep for your procedure, you may not have a normal bowel movement for a few days.  Please Note:  You might notice some irritation and congestion in your nose or some drainage.  This is from the oxygen used during your procedure.  There is no need for concern and it should clear up in a day or so.  SYMPTOMS TO REPORT IMMEDIATELY:   Following lower endoscopy (colonoscopy or flexible sigmoidoscopy):  Excessive amounts of blood in the stool  Significant tenderness or worsening of abdominal pains  Swelling of the abdomen that is new, acute  Fever of 100F or higher    For urgent or emergent issues, a gastroenterologist can be reached at any hour by calling [phone].   DIET:  We do recommend a small meal at first, but then you may proceed to your regular diet.  Drink plenty of fluids but you should avoid alcoholic beverages for 24 hours.  ACTIVITY:  You should plan to take it easy for the rest of today and you should NOT DRIVE or use heavy machinery  until tomorrow (because of the sedation medicines used during the test).    FOLLOW UP: Our staff will call the number listed on your records 48-72 hours following your procedure to check on you and address any questions or concerns that you may have regarding the information given to you following your procedure. If we do not reach you, we will leave a message.  We will attempt to reach you two times.  During this call, we will ask if you have developed any symptoms of COVID 19. If you develop any symptoms (ie: fever, flu-like symptoms, shortness of breath, cough etc.) before then, please call [phone].  If you test positive for Covid 19 in the 2 weeks post procedure, please call and report this information to us.    If any biopsies were taken you will be contacted by phone or by letter within the next 1-3 weeks.  Please call us at [phone] if you have not heard about the biopsies in 3 weeks.    SIGNATURES/CONFIDENTIALITY: You and/or your care partner have signed paperwork which will be entered into your electronic medical record.  These signatures attest to the fact that that the information above on your After Visit Summary has been reviewed and is understood.  Full responsibility of the confidentiality of this discharge information lies with you and/or your care-partner. 

## 2020-01-02 NOTE — Progress Notes (Signed)
Report to PACU, RN, vss, BBS= Clear.  

## 2020-01-04 ENCOUNTER — Telehealth: Payer: Self-pay | Admitting: *Deleted

## 2020-01-04 NOTE — Telephone Encounter (Signed)
  Follow up Call-  Call back number 01/02/2020  Post procedure Call Back phone  # [phone]  Permission to leave phone message No  Some recent data might be hidden     Patient questions:  Message left to call us if necessary.

## 2020-02-06 ENCOUNTER — Other Ambulatory Visit: Payer: Self-pay | Admitting: Rheumatology

## 2020-02-06 NOTE — Telephone Encounter (Addendum)
Last Visit:12/15/2019 Next Visit:04/17/2020 Labs:12/23/2020CBC and CMP are within normal limits.  Patient advised he is due to update labs. Patient states he will come into the office this week to update.   Okay to refill 30 day supply MTX?

## 2020-02-06 NOTE — Telephone Encounter (Signed)
Ok to refill 30 day supply of MTX

## 2020-02-13 ENCOUNTER — Other Ambulatory Visit: Payer: Self-pay

## 2020-02-13 DIAGNOSIS — Z79899 Other long term (current) drug therapy: Secondary | ICD-10-CM

## 2020-02-14 LAB — CBC WITH DIFFERENTIAL/PLATELET
Absolute Monocytes: 788 cells/uL (ref 200–950)
Basophils Absolute: 58 cells/uL (ref 0–200)
Basophils Relative: 0.8 %
Eosinophils Absolute: 139 cells/uL (ref 15–500)
Eosinophils Relative: 1.9 %
HCT: 45.1 % (ref 38.5–50.0)
Hemoglobin: 15.5 g/dL (ref 13.2–17.1)
Lymphs Abs: 1650 cells/uL (ref 850–3900)
MCH: 31.2 pg (ref 27.0–33.0)
MCHC: 34.4 g/dL (ref 32.0–36.0)
MCV: 90.7 fL (ref 80.0–100.0)
MPV: 12.3 fL (ref 7.5–12.5)
Monocytes Relative: 10.8 %
Neutro Abs: 4665 cells/uL (ref 1500–7800)
Neutrophils Relative %: 63.9 %
Platelets: 236 10*3/uL (ref 140–400)
RBC: 4.97 10*6/uL (ref 4.20–5.80)
RDW: 13.2 % (ref 11.0–15.0)
Total Lymphocyte: 22.6 %
WBC: 7.3 10*3/uL (ref 3.8–10.8)

## 2020-02-14 LAB — COMPLETE METABOLIC PANEL WITH GFR
AG Ratio: 1.4 (calc) (ref 1.0–2.5)
ALT: 36 U/L (ref 9–46)
AST: 31 U/L (ref 10–35)
Albumin: 4.2 g/dL (ref 3.6–5.1)
Alkaline phosphatase (APISO): 91 U/L (ref 35–144)
BUN: 16 mg/dL (ref 7–25)
CO2: 25 mmol/L (ref 20–32)
Calcium: 9.3 mg/dL (ref 8.6–10.3)
Chloride: 106 mmol/L (ref 98–110)
Creat: 0.99 mg/dL (ref 0.70–1.33)
GFR, Est African American: 100 mL/min/{1.73_m2} (ref >=60)
GFR, Est Non African American: 86 mL/min/{1.73_m2} (ref >=60)
Globulin: 2.9 g/dL (calc) (ref 1.9–3.7)
Glucose, Bld: 90 mg/dL (ref 65–99)
Potassium: 4.1 mmol/L (ref 3.5–5.3)
Sodium: 140 mmol/L (ref 135–146)
Total Bilirubin: 1 mg/dL (ref 0.2–1.2)
Total Protein: 7.1 g/dL (ref 6.1–8.1)

## 2020-02-14 NOTE — Progress Notes (Signed)
CBC and CMP are normal.

## 2020-03-28 ENCOUNTER — Other Ambulatory Visit: Payer: Self-pay | Admitting: Rheumatology

## 2020-03-28 NOTE — Telephone Encounter (Signed)
Last Visit: 12/15/2019 Next Visit: 04/17/2020 Labs: 02/13/2020 CBC and CMP are normal.  Okay to refill per Dr. Corliss Skains.

## 2020-04-03 NOTE — Progress Notes (Signed)
Office Visit Note  Patient: Ricky Hardin             Date of Birth: July 31, 1966           MRN: [id number]             PCP: Bo Merino, MD Referring: No ref. provider found Visit Date: 04/17/2020 Occupation: @GUAROCC @  Subjective:  Discuss covid-19 vaccine   History of Present Illness: Ricky Hardin is a 54 y.o. male with history of seropositive rheumatoid arthritis.  Patient is on [name] 40 mg subcutaneous injections every 14 days, methotrexate 0.7 mL subcutaneous injections once weekly and folic acid 2 mg by mouth daily.  He has not missed any doses of [name] or methotrexate recently.  He denies any recent rheumatoid arthritis flares.  He has noticed less arthralgias since resuming [name] injections every 14 days instead of every 21 days.  He has not received the COVID-19 vaccination yet and is apprehensive since he is on [name] and methotrexate.  He denies any recent infections.    Activities of Daily Living:  Patient reports morning stiffness for 5   minutes.   Patient Denies nocturnal pain.  Difficulty dressing/grooming: Denies Difficulty climbing stairs: Denies Difficulty getting out of chair: Denies Difficulty using hands for taps, buttons, cutlery, and/or writing: Denies  No Rheumatology ROS completed.   PMFS History:  Patient Active Problem List   Diagnosis Date Noted  . RA (rheumatoid arthritis) ([place]) 09/19/2016  . Osteoarthritis of both hands 09/19/2016  . Osteoarthritis of both feet 09/19/2016    Past Medical History:  Diagnosis Date  . Osteoarthritis of both feet 09/19/2016  . Osteoarthritis of both hands 09/19/2016  . RA (rheumatoid arthritis) ([place]) 09/19/2016   +RF, +CCP Erosive disease    Family History  Problem Relation Age of Onset  . Colon polyps Mother   . Heart disease Father 61  . Rheum arthritis Sister   . Colon cancer Neg Hx   . Esophageal cancer Neg Hx   . Stomach cancer Neg Hx   . Rectal cancer Neg Hx    History reviewed. No  pertinent surgical history. Social History   Social History Narrative  . Not on file    There is no immunization history on file for this patient.   Objective: Vital Signs: Resp 14   Ht 5\' 10"  (1.778 m)   Wt 195 lb 12.8 oz (88.8 kg)   BMI 28.09 kg/m    Physical Exam Vitals and nursing note reviewed.  Constitutional:      Appearance: He is well-developed.  HENT:     Head: Normocephalic and atraumatic.  Eyes:     Conjunctiva/sclera: Conjunctivae normal.     Pupils: Pupils are equal, round, and reactive to light.  Pulmonary:     Effort: Pulmonary effort is normal.  Abdominal:     General: Bowel sounds are normal.     Palpations: Abdomen is soft.  Musculoskeletal:     Cervical back: Normal range of motion and neck supple.  Skin:    General: Skin is warm and dry.     Capillary Refill: Capillary refill takes less than 2 seconds.  Neurological:     Mental Status: He is alert and oriented to person, place, and time.  Psychiatric:        Behavior: Behavior normal.      Musculoskeletal Exam: C-spine, thoracic spine, lumbar spine good range of motion.  Shoulder joints, with joints, wrist joints, MCPs, PIPs, DIPs good  range of motion with no synovitis.  He has PIP and DIP thickening consistent with osteoarthritis of both hands.  He has complete fist formation bilaterally.  Hip joints have good range of motion with no discomfort.  Knee joints and ankle joints have good range of motion with no tenderness or inflammation.  No tenderness of MTPs.  CDAI Exam: CDAI Score: -- Patient Global: --; Provider Global: -- Swollen: --; Tender: -- Joint Exam 04/17/2020   No joint exam has been documented for this visit   There is currently no information documented on the homunculus. Go to the Rheumatology activity and complete the homunculus joint exam.  Investigation: No additional findings.  Imaging: No results found.  Recent Labs: Lab Results  Component Value Date   WBC 7.3  02/13/2020   HGB 15.5 02/13/2020   PLT 236 02/13/2020   NA 140 02/13/2020   K 4.1 02/13/2020   CL 106 02/13/2020   CO2 25 02/13/2020   GLUCOSE 90 02/13/2020   BUN 16 02/13/2020   CREATININE 0.99 02/13/2020   BILITOT 1.0 02/13/2020   ALKPHOS 83 05/05/2017   AST 31 02/13/2020   ALT 36 02/13/2020   PROT 7.1 02/13/2020   ALBUMIN 4.3 05/05/2017   CALCIUM 9.3 02/13/2020   GFRAA 100 02/13/2020   QFTBGOLDPLUS NEGATIVE 04/11/2019    Speciality Comments: No specialty comments available.  Procedures:  No procedures performed Allergies: Patient has no known allergies.   Assessment / Plan:     Visit Diagnoses: Rheumatoid arthritis involving multiple sites with positive rheumatoid factor (HCC): He has no synovitis or tenderness on exam.  He has not had any recent rheumatoid arthritis flares.  He has had less arthralgias since resuming [name] 40 mg subcutaneous injections every 14 days instead of every 21 days.  His morning stiffness has been lasting about 5 minutes.  He has not had any nocturnal pain.  He has no difficulty with ADLs and remains active.  He will continue on the current treatment regimen. He does not need any refills at this time.  We discussed spacing [name] and methotrexate prior to and after receiving both COVID-19 vaccinations since he is clinically been doing well.  He is in agreement.  He has not had any recent infections.  He was advised to notify us if he develops increased joint pain or joint swelling after spacing the doses of [name] and methotrexate.  He will follow-up in the office in 5 months.  High risk medication use - [name] 40 mg sq every 14 days, MTX 0.7 ml once weekly, and folic acid 2 mg.  TB gold was negative on 04/11/2019.  He is due to update TB Gold today.  Order was released.  CBC and CMP were within normal limits on 02/13/2020.  He will be due to update lab work in July and every 3 months to monitor for drug toxicity.  Standing orders for CBC and CMP are in place.   He has not had any recent infections.  He is planning to receive both COVID-19 vaccinations.  We discussed holding [name] 2 weeks prior to the vaccines and methotrexate 1 week prior and 1 week after both vaccines.- Plan: QuantiFERON-TB Gold Plus  Primary osteoarthritis of both hands: He has PIP and DIP thickening consistent with osteoarthritis of both hands.  No inflammation noted.  He has complete fist formation bilaterally.  Joint protection and muscle strengthening were discussed.  Primary osteoarthritis of both feet: He is not experiencing any discomfort in his feet  at this time.  He wears proper fitting shoes.  Family history of heart disease  Orders: Orders Placed This Encounter  Procedures  . QuantiFERON-TB Gold Plus   No orders of the defined types were placed in this encounter.   Face-to-face time spent with patient was 30 minutes. Greater than 50% of time was spent in counseling and coordination of care.  Follow-Up Instructions: Return in about 5 months (around 09/17/2020) for Rheumatoid arthritis.   Gearldine Bienenstock, PA-C  Note - This record has been created using Dragon software.  Chart creation errors have been sought, but may not always  have been located. Such creation errors do not reflect on  the standard of medical care.

## 2020-04-17 ENCOUNTER — Encounter: Payer: Self-pay | Admitting: Physician Assistant

## 2020-04-17 ENCOUNTER — Ambulatory Visit (INDEPENDENT_AMBULATORY_CARE_PROVIDER_SITE_OTHER): Payer: Commercial Managed Care - PPO | Admitting: Physician Assistant

## 2020-04-17 ENCOUNTER — Other Ambulatory Visit: Payer: Self-pay

## 2020-04-17 VITALS — BP 133/86 | HR 71 | Resp 14 | Ht 70.0 in | Wt 195.8 lb

## 2020-04-17 DIAGNOSIS — Z8249 Family history of ischemic heart disease and other diseases of the circulatory system: Secondary | ICD-10-CM

## 2020-04-17 DIAGNOSIS — M0579 Rheumatoid arthritis with rheumatoid factor of multiple sites without organ or systems involvement: Secondary | ICD-10-CM

## 2020-04-17 DIAGNOSIS — Z79899 Other long term (current) drug therapy: Secondary | ICD-10-CM | POA: Diagnosis not present

## 2020-04-17 DIAGNOSIS — M19071 Primary osteoarthritis, right ankle and foot: Secondary | ICD-10-CM | POA: Diagnosis not present

## 2020-04-17 DIAGNOSIS — M19042 Primary osteoarthritis, left hand: Secondary | ICD-10-CM

## 2020-04-17 DIAGNOSIS — M19041 Primary osteoarthritis, right hand: Secondary | ICD-10-CM

## 2020-04-17 DIAGNOSIS — M19072 Primary osteoarthritis, left ankle and foot: Secondary | ICD-10-CM

## 2020-04-19 LAB — QUANTIFERON-TB GOLD PLUS
Mitogen-NIL: >10 IU/mL
NIL: 0.02 IU/mL
QuantiFERON-TB Gold Plus: NEGATIVE
TB1-NIL: 0.01 IU/mL
TB2-NIL: 0 IU/mL

## 2020-04-19 NOTE — Progress Notes (Signed)
TB gold negative

## 2020-04-26 ENCOUNTER — Other Ambulatory Visit: Payer: Self-pay | Admitting: Rheumatology

## 2020-04-26 NOTE — Telephone Encounter (Signed)
Last Visit: 04/17/2020 Next Visit: 09/18/2020 Labs: 02/13/2020 CBC and CMP are normal  Current Dose per office note on 04/17/2020: MTX 0.7 ml once weekly  Okay to refill per Dr. Corliss Skains

## 2020-05-17 ENCOUNTER — Other Ambulatory Visit: Payer: Self-pay | Admitting: Rheumatology

## 2020-05-17 NOTE — Telephone Encounter (Signed)
Last Visit: 04/17/2020 Next Visit: 09/18/2020  Okay to refill per Dr. Deveshwar  

## 2020-05-28 ENCOUNTER — Other Ambulatory Visit: Payer: Self-pay | Admitting: Rheumatology

## 2020-05-28 NOTE — Telephone Encounter (Addendum)
Last Visit: 04/17/2020 Next Visit: 09/18/2020 Labs: 02/13/2020 CBC and CMP are normal  Current Dose per office note on 04/17/2020: MTX 0.7 ml once weekly DX: Rheumatoid arthritis involving multiple sites with positive rheumatoid factor   Patient advised he is due to update labs.   Okay to refill 30 day supply MTX?

## 2020-06-01 ENCOUNTER — Other Ambulatory Visit: Payer: Self-pay | Admitting: *Deleted

## 2020-06-01 DIAGNOSIS — Z79899 Other long term (current) drug therapy: Secondary | ICD-10-CM

## 2020-06-02 LAB — COMPLETE METABOLIC PANEL WITH GFR
AG Ratio: 1.6 (calc) (ref 1.0–2.5)
ALT: 31 U/L (ref 9–46)
AST: 22 U/L (ref 10–35)
Albumin: 4.4 g/dL (ref 3.6–5.1)
Alkaline phosphatase (APISO): 79 U/L (ref 35–144)
BUN: 23 mg/dL (ref 7–25)
CO2: 24 mmol/L (ref 20–32)
Calcium: 9.3 mg/dL (ref 8.6–10.3)
Chloride: 106 mmol/L (ref 98–110)
Creat: 1.01 mg/dL (ref 0.70–1.33)
GFR, Est African American: 97 mL/min/{1.73_m2} (ref >=60)
GFR, Est Non African American: 84 mL/min/{1.73_m2} (ref >=60)
Globulin: 2.8 g/dL (calc) (ref 1.9–3.7)
Glucose, Bld: 130 mg/dL — ABNORMAL HIGH (ref 65–99)
Potassium: 4 mmol/L (ref 3.5–5.3)
Sodium: 138 mmol/L (ref 135–146)
Total Bilirubin: 1.2 mg/dL (ref 0.2–1.2)
Total Protein: 7.2 g/dL (ref 6.1–8.1)

## 2020-06-02 LAB — CBC WITH DIFFERENTIAL/PLATELET
Absolute Monocytes: 607 cells/uL (ref 200–950)
Basophils Absolute: 59 cells/uL (ref 0–200)
Basophils Relative: 0.9 %
Eosinophils Absolute: 152 cells/uL (ref 15–500)
Eosinophils Relative: 2.3 %
HCT: 45.3 % (ref 38.5–50.0)
Hemoglobin: 15.2 g/dL (ref 13.2–17.1)
Lymphs Abs: 2039 cells/uL (ref 850–3900)
MCH: 30.3 pg (ref 27.0–33.0)
MCHC: 33.6 g/dL (ref 32.0–36.0)
MCV: 90.4 fL (ref 80.0–100.0)
MPV: 12.2 fL (ref 7.5–12.5)
Monocytes Relative: 9.2 %
Neutro Abs: 3742 cells/uL (ref 1500–7800)
Neutrophils Relative %: 56.7 %
Platelets: 230 10*3/uL (ref 140–400)
RBC: 5.01 10*6/uL (ref 4.20–5.80)
RDW: 13 % (ref 11.0–15.0)
Total Lymphocyte: 30.9 %
WBC: 6.6 10*3/uL (ref 3.8–10.8)

## 2020-06-10 HISTORY — PX: COLONOSCOPY: SHX174

## 2020-07-09 ENCOUNTER — Other Ambulatory Visit: Payer: Self-pay | Admitting: Physician Assistant

## 2020-07-09 NOTE — Telephone Encounter (Signed)
Last Visit: 04/17/2020 Next Visit: 09/18/2020 Labs: 06/01/2020 Glucose is 130. Rest of CMP WNL. CBC WNL.  Current Dose per office note on 04/17/2020:MTX 0.7 ml once weekly DX:Rheumatoid arthritis involving multiple sites with positive rheumatoid factor  Okay to refill per Dr. Deveshwar  

## 2020-07-31 ENCOUNTER — Other Ambulatory Visit: Payer: Self-pay | Admitting: Rheumatology

## 2020-07-31 NOTE — Telephone Encounter (Signed)
Last Visit: 04/17/2020 Next Visit: 09/18/2020 Labs: 06/01/2020 Glucose is 130. Rest of CMP WNL. CBC WNL.  Current Dose per office note on 04/17/2020:MTX 0.7 ml once weekly ID:[id number] arthritis involving multiple sites with positive rheumatoid factor  Okay to refill per Dr. Corliss Skains

## 2020-08-10 NOTE — Telephone Encounter (Signed)
Received notification from [employer] regarding a prior authorization for [name]. Authorization has been APPROVED from 09/01/20 to 08/31/21.   Authorization # [id number]

## 2020-09-04 NOTE — Progress Notes (Addendum)
Office Visit Note  Patient: Ricky Hardin             Date of Birth: 1966-07-08           MRN: [id number]             PCP: Pollyann Savoy, MD Referring: Pollyann Savoy, MD Visit Date: 09/18/2020 Occupation: @GUAROCC @  Subjective:  Discussed the COVID-19 vaccination  History of Present Illness: Ricky Hardin is a 54 y.o. male with history of seropositive rheumatoid arthritis and osteoarthritis.  Patient is on [name] 40 mg subcutaneous injections every 14 days, methotrexate 0.7 mL sq injections once weekly, and folic acid 2 mg by mouth daily.  He denies any recent rheumatoid arthritis flares.  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  He denies any joint swelling recently.  He reports that he has drastically changed his diet since his wife was diagnosed with type 2 diabetes.  He has been trying to limit sugar intake as well as eliminating gluten.   He denies any recent infections.  He states that he is considering scheduling COVID-19 vaccine injections and would like to discuss spacing [name] and methotrexate accordingly. Patient reports that he is due for his yearly skin exam with his dermatologist.    Activities of Daily Living:  Patient reports morning stiffness for 0 minutes.   Patient Denies nocturnal pain.  Difficulty dressing/grooming: Denies Difficulty climbing stairs: Denies Difficulty getting out of chair: Denies Difficulty using hands for taps, buttons, cutlery, and/or writing: Reports  Review of Systems  Constitutional: Negative for fatigue.  HENT: Negative for mouth sores, mouth dryness and nose dryness.   Eyes: Negative for pain, itching and dryness.  Respiratory: Negative for shortness of breath and difficulty breathing.   Cardiovascular: Negative for chest pain and palpitations.  Gastrointestinal: Negative for blood in stool, constipation and diarrhea.  Endocrine: Negative  for increased urination.  Genitourinary: Negative for difficulty urinating.  Musculoskeletal: Positive for arthralgias and joint pain. Negative for joint swelling, myalgias, morning stiffness, muscle tenderness and myalgias.  Skin: Negative for color change, rash and redness.  Allergic/Immunologic: Negative for susceptible to infections.  Neurological: Negative for dizziness, numbness, headaches, memory loss and weakness.  Hematological: Negative for bruising/bleeding tendency.  Psychiatric/Behavioral: Negative for confusion and sleep disturbance.    PMFS History:  Patient Active Problem List   Diagnosis Date Noted  . RA (rheumatoid arthritis) ([facility]) 09/19/2016  . Osteoarthritis of both hands 09/19/2016  . Osteoarthritis of both feet 09/19/2016    Past Medical History:  Diagnosis Date  . Osteoarthritis of both feet 09/19/2016  . Osteoarthritis of both hands 09/19/2016  . RA (rheumatoid arthritis) ([facility]) 09/19/2016   +RF, +CCP Erosive disease    Family History  Problem Relation Age of Onset  . Colon polyps Mother   . Heart disease Father 43  . Rheum arthritis Sister   . Colon cancer Neg Hx   . Esophageal cancer Neg Hx   . Stomach cancer Neg Hx   . Rectal cancer Neg Hx    Past Surgical History:  Procedure Laterality Date  . COLONOSCOPY  06/2020   Social History   Social History Narrative  . Not on file    There is no immunization history on file for this patient.   Objective: Vital Signs: BP 112/75 (BP Location: Left Arm, Patient Position: Sitting, Cuff Size: Normal)   Pulse 70   Resp 16  Ht 5\' 10"  (1.778 m)   Wt 180 lb 9.6 oz (81.9 kg)   BMI 25.91 kg/m    Physical Exam Vitals and nursing note reviewed.  Constitutional:      Appearance: He is well-developed.  HENT:     Head: Normocephalic and atraumatic.  Eyes:     Conjunctiva/sclera: Conjunctivae normal.     Pupils: Pupils are equal, round, and reactive to light.  Pulmonary:     Effort: Pulmonary effort  is normal.  Abdominal:     Palpations: Abdomen is soft.  Musculoskeletal:     Cervical back: Normal range of motion and neck supple.  Skin:    General: Skin is warm and dry.     Capillary Refill: Capillary refill takes less than 2 seconds.  Neurological:     Mental Status: He is alert and oriented to person, place, and time.  Psychiatric:        Behavior: Behavior normal.      Musculoskeletal Exam: C-spine, thoracic spine, lumbar spine have good range of motion with no discomfort.  Shoulder joints and elbow joints have good range of motion with no discomfort.  Limited flexion extension of the left wrist joint noted.  No tenderness of wrist joints, MCPs, PIPs, DIPs.  He has PIP and DIP thickening consistent with osteoarthritis of both hands.  He is able to make a complete fist bilaterally.  Hip joints have good range of motion with no discomfort at this time.  Knee joints have good range of motion with no warmth or effusion.  Ankle joints have good range of motion with no tenderness or inflammation.  Declined foot exam.  CDAI Exam: CDAI Score: 0.2  Patient Global: 1 mm; Provider Global: 1 mm Swollen: 0 ; Tender: 0  Joint Exam 09/18/2020   No joint exam has been documented for this visit   There is currently no information documented on the homunculus. Go to the Rheumatology activity and complete the homunculus joint exam.  Investigation: No additional findings.  Imaging: No results found.  Recent Labs: Lab Results  Component Value Date   WBC 6.6 06/01/2020   HGB 15.2 06/01/2020   PLT 230 06/01/2020   NA 138 06/01/2020   K 4.0 06/01/2020   CL 106 06/01/2020   CO2 24 06/01/2020   GLUCOSE 130 (H) 06/01/2020   BUN 23 06/01/2020   CREATININE 1.01 06/01/2020   BILITOT 1.2 06/01/2020   ALKPHOS 83 05/05/2017   AST 22 06/01/2020   ALT 31 06/01/2020   PROT 7.2 06/01/2020   ALBUMIN 4.3 05/05/2017   CALCIUM 9.3 06/01/2020   GFRAA 97 06/01/2020   QFTBGOLDPLUS NEGATIVE  04/17/2020    Speciality Comments: No specialty comments available.  Procedures:  No procedures performed Allergies: Patient has no known allergies.   Assessment / Plan:     Visit Diagnoses: Rheumatoid arthritis involving multiple sites with positive rheumatoid factor [facility]): He has no joint tenderness or synovitis on exam.  He has not had any recent rheumatoid arthritis flares.  He is clinically doing well on [name] 40 mg subcutaneous injections every 14 days, methotrexate 0.7 mL subcu injections once weekly, and folic acid 2 mg by mouth daily.  He has found this combination of medications to be effective at managing his rheumatoid arthritis.  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  He will continue on the current treatment regimen.  He does  not need any refills at this time.  He was advised to notify us if he develops increased joint pain or joint swelling as he plans to space the dosing of methotrexate and [name] before and after receiving the COVID-19 vaccinations.  He will follow-up in the office in 5 months.  High risk medication use - [name] 40 mg sq every 14 days, MTX 0.7 ml once weekly, and folic acid 2 mg.  CBC and CMP were drawn on 06/01/2020.  He is overdue to update lab work today.  Orders for CBC and CMP were released.  His next lab work will be due in February and every 3 months to monitor for drug toxicity.  Standing orders for CBC and CMP remain in place.  TB gold was negative on 04/17/2020 and will continue to be monitored yearly.- Plan: CBC with Differential/Platelet, COMPLETE METABOLIC PANEL WITH GFR He has not had any recent infections.  We discussed the importance of holding [name] and methotrexate if he develops signs or symptoms of an infection and to resume once the infection has completely cleared.  He is considering receiving the COVID-19 vaccinations.  He was  advised to hold methotrexate 1 week after receiving each vaccine dose.  He plans on spacing the dose of [name] before and after the vaccine as well.  He was advised to notify us or his PCP if he develops a COVID-19 infection in order to receive the monoclonal antibody infusion.  He voiced understanding.  He is encouraged to continue to wear a mask and social distance. He is due for his yearly skin exam by his dermatologist.  Primary osteoarthritis of both hands: He has PIP and DIP thickening consistent with osteoarthritis of both hands.  He has no joint tenderness or inflammation on examination today.  He is able to make a complete fist bilaterally.  Joint protection and muscle strengthening were discussed.  He was given a list of natural anti-inflammatories to start taking.  He is already taking omega-3 1000 units daily but was encouraged to increase the dose to 2 g twice daily or obtain more omega 3 through his diet. We discussed the importance of following a more plant based diet.   Primary osteoarthritis of both feet: He has occasional discomfort and stiffness in his feet but has not noticed any joint swelling.  He has good range of motion of both ankle joints with no tenderness or inflammation.  Association of heart disease with rheumatoid arthritis was discussed. Need to monitor blood pressure, cholesterol, and to exercise 30-60 minutes on daily basis was discussed. Poor dental hygiene can be a predisposing factor for rheumatoid arthritis. Good dental hygiene was discussed.  Family history of heart disease  Diabetes mellitus screening - He requested to have his hemoglobin A1c checked today plan: HgB A1c  Orders: Orders Placed This Encounter  Procedures  . CBC with Differential/Platelet  . COMPLETE METABOLIC PANEL WITH GFR  . HgB A1c   No orders of the defined types were placed in this encounter.   Follow-Up Instructions: Return in about 5 months (around 02/16/2021) for Rheumatoid  arthritis, Osteoarthritis.   Gearldine Bienenstock, PA-C  Note - This record has been created using Dragon software.  Chart creation errors have been sought, but may not always  have been located. Such creation errors do not reflect on  the standard of medical care.

## 2020-09-18 ENCOUNTER — Other Ambulatory Visit: Payer: Self-pay

## 2020-09-18 ENCOUNTER — Ambulatory Visit (INDEPENDENT_AMBULATORY_CARE_PROVIDER_SITE_OTHER): Payer: Commercial Managed Care - PPO | Admitting: Physician Assistant

## 2020-09-18 ENCOUNTER — Encounter: Payer: Self-pay | Admitting: Physician Assistant

## 2020-09-18 VITALS — BP 112/75 | HR 70 | Resp 16 | Ht 70.0 in | Wt 180.6 lb

## 2020-09-18 DIAGNOSIS — Z131 Encounter for screening for diabetes mellitus: Secondary | ICD-10-CM

## 2020-09-18 DIAGNOSIS — Z8249 Family history of ischemic heart disease and other diseases of the circulatory system: Secondary | ICD-10-CM

## 2020-09-18 DIAGNOSIS — M19041 Primary osteoarthritis, right hand: Secondary | ICD-10-CM | POA: Diagnosis not present

## 2020-09-18 DIAGNOSIS — Z79899 Other long term (current) drug therapy: Secondary | ICD-10-CM | POA: Diagnosis not present

## 2020-09-18 DIAGNOSIS — M0579 Rheumatoid arthritis with rheumatoid factor of multiple sites without organ or systems involvement: Secondary | ICD-10-CM | POA: Diagnosis not present

## 2020-09-18 DIAGNOSIS — M19072 Primary osteoarthritis, left ankle and foot: Secondary | ICD-10-CM

## 2020-09-18 DIAGNOSIS — M19071 Primary osteoarthritis, right ankle and foot: Secondary | ICD-10-CM

## 2020-09-18 DIAGNOSIS — M19042 Primary osteoarthritis, left hand: Secondary | ICD-10-CM

## 2020-09-18 NOTE — Patient Instructions (Signed)
Standing Labs We placed an order today for your standing lab work.   Please have your standing labs drawn in February and every 3 months   If possible, please have your labs drawn 2 weeks prior to your appointment so that the provider can discuss your results at your appointment.  We have open lab daily Monday through Thursday from 8:30-12:30 PM and 1:30-4:30 PM and Friday from 8:30-12:30 PM and 1:30-4:00 PM at the office of Dr. Shaili Deveshwar, [facility].   Please be advised, patients with office appointments requiring lab work will take precedents over walk-in lab work.  If possible, please come for your lab work on Monday and Friday afternoons, as you may experience shorter wait times. The office is located at [address], [address], [place], [place] [address] No appointment is necessary.   Labs are drawn by [facility]. Please bring your co-pay at the time of your lab draw.  You may receive a bill from [facility] for your lab work.  If you wish to have your labs drawn at another location, please call the office 24 hours in advance to send orders.  If you have any questions regarding directions or hours of operation,  please call [phone].   As a reminder, please drink plenty of water prior to coming for your lab work. Thanks!  COVID-19 vaccine recommendations:   COVID-19 vaccine is recommended for everyone (unless you are allergic to a vaccine component), even if you are on a medication that suppresses your immune system.   If you are on Methotrexate, Cellcept (mycophenolate), Rinvoq, Xeljanz, and Olumiant- hold the medication for 1 week after each vaccine. Hold Methotrexate for 2 weeks after the single dose COVID-19 vaccine.   If you are on Orencia subcutaneous injection - hold medication one week prior to and one week after the first COVID-19 vaccine dose (only).   If you are on Orencia IV infusions- time vaccination administration so that the first COVID-19  vaccination will occur four weeks after the infusion and postpone the subsequent infusion by one week.   If you are on Cyclophosphamide or Rituxan infusions please contact your doctor prior to receiving the COVID-19 vaccine.   Do not take Tylenol or any anti-inflammatory medications (NSAIDs) 24 hours prior to the COVID-19 vaccination.   There is no direct evidence about the efficacy of the COVID-19 vaccine in individuals who are on medications that suppress the immune system.   Even if you are fully vaccinated, and you are on any medications that suppress your immune system, please continue to wear a mask, maintain at least six feet social distance and practice hand hygiene.   If you develop a COVID-19 infection, please contact your PCP or our office to determine if you need monoclonal antibody infusion.  The booster vaccine is now available for immunocompromised patients.   Please see the following web sites for updated information.   [email or web address]     

## 2020-09-19 LAB — CBC WITH DIFFERENTIAL/PLATELET
Absolute Monocytes: 710 cells/uL (ref 200–950)
Basophils Absolute: 47 cells/uL (ref 0–200)
Basophils Relative: 0.7 %
Eosinophils Absolute: 241 cells/uL (ref 15–500)
Eosinophils Relative: 3.6 %
HCT: 45.6 % (ref 38.5–50.0)
Hemoglobin: 15.4 g/dL (ref 13.2–17.1)
Lymphs Abs: 1930 cells/uL (ref 850–3900)
MCH: 30.5 pg (ref 27.0–33.0)
MCHC: 33.8 g/dL (ref 32.0–36.0)
MCV: 90.3 fL (ref 80.0–100.0)
MPV: 12.2 fL (ref 7.5–12.5)
Monocytes Relative: 10.6 %
Neutro Abs: 3772 cells/uL (ref 1500–7800)
Neutrophils Relative %: 56.3 %
Platelets: 237 10*3/uL (ref 140–400)
RBC: 5.05 10*6/uL (ref 4.20–5.80)
RDW: 12.5 % (ref 11.0–15.0)
Total Lymphocyte: 28.8 %
WBC: 6.7 10*3/uL (ref 3.8–10.8)

## 2020-09-19 LAB — COMPLETE METABOLIC PANEL WITH GFR
AG Ratio: 1.6 (calc) (ref 1.0–2.5)
ALT: 23 U/L (ref 9–46)
AST: 22 U/L (ref 10–35)
Albumin: 4.3 g/dL (ref 3.6–5.1)
Alkaline phosphatase (APISO): 73 U/L (ref 35–144)
BUN: 23 mg/dL (ref 7–25)
CO2: 26 mmol/L (ref 20–32)
Calcium: 9.6 mg/dL (ref 8.6–10.3)
Chloride: 106 mmol/L (ref 98–110)
Creat: 0.92 mg/dL (ref 0.70–1.33)
GFR, Est African American: 109 mL/min/{1.73_m2} (ref >=60)
GFR, Est Non African American: 94 mL/min/{1.73_m2} (ref >=60)
Globulin: 2.7 g/dL (calc) (ref 1.9–3.7)
Glucose, Bld: 112 mg/dL — ABNORMAL HIGH (ref 65–99)
Potassium: 4 mmol/L (ref 3.5–5.3)
Sodium: 139 mmol/L (ref 135–146)
Total Bilirubin: 0.8 mg/dL (ref 0.2–1.2)
Total Protein: 7 g/dL (ref 6.1–8.1)

## 2020-09-19 LAB — HEMOGLOBIN A1C
Hgb A1c MFr Bld: 5.2 % of total Hgb (ref <5.7)
Mean Plasma Glucose: 103 (calc)
eAG (mmol/L): 5.7 (calc)

## 2020-09-19 NOTE — Progress Notes (Signed)
Glucose is 112. Rest of CMP WNL. HgbA1c WNL-5.2. CBC WNL.  No change in therapy at this time.

## 2020-10-12 ENCOUNTER — Other Ambulatory Visit: Payer: Self-pay | Admitting: Rheumatology

## 2020-10-12 DIAGNOSIS — M0579 Rheumatoid arthritis with rheumatoid factor of multiple sites without organ or systems involvement: Secondary | ICD-10-CM

## 2020-10-12 NOTE — Telephone Encounter (Signed)
Last Visit: 09/18/2020 Next Visit: 02/12/2021 Labs: 09/18/2020 Glucose is 112. Rest of CMP WNL. HgbA1c WNL-5.2. CBC WNL.  No change in therapy at this time.  TB Gold: 04/17/2020 Neg   Current Dose per office note 09/18/2020: [name] 40 mg sq every 14 days  DX: Rheumatoid arthritis involving multiple sites with positive rheumatoid factor   Okay to refill per Dr. Corliss Skains

## 2020-11-06 ENCOUNTER — Other Ambulatory Visit: Payer: Self-pay | Admitting: Rheumatology

## 2020-11-06 NOTE — Telephone Encounter (Signed)
Last Visit: 09/18/2020  Next Visit: 02/12/2021 Labs: 09/18/2020 Glucose is 112. Rest of CMP WNL. HgbA1c WNL-5.2. CBC WNL.  No change in therapy at this time.   Current Dose per office note on 09/18/2020: MTX 0.7 ml once weekly DX: Rheumatoid arthritis involving multiple sites with positive rheumatoid factor   Okay to refill methotrexate?

## 2021-01-09 ENCOUNTER — Other Ambulatory Visit: Payer: Self-pay | Admitting: Rheumatology

## 2021-01-09 NOTE — Telephone Encounter (Signed)
Last Visit: 09/18/2020  Next Visit: 02/12/2021   Current Dose per office note on 09/18/2020:  folic acid 2 mg by mouth daily DX: Rheumatoid arthritis involving multiple sites with positive rheumatoid factor   Okay to refill per Dr. Corliss Skains

## 2021-01-23 ENCOUNTER — Other Ambulatory Visit: Payer: Self-pay | Admitting: Rheumatology

## 2021-01-23 NOTE — Telephone Encounter (Signed)
Next Visit: 02/12/2021  Last Visit: 09/18/2020  Last Fill: 11/06/2020  SU:[id number] arthritis involving multiple sites with positive rheumatoid factor   Current Dose per office note 09/18/2020, MTX 0.7 ml once weekly  Labs: 11/92/2021, Glucose is 112. Rest of CMP WNL. HgbA1c WNL-5.2. CBC WNL.  No change in therapy at this time.   LMOM labs due.  Okay to refill MTX?

## 2021-01-24 NOTE — Telephone Encounter (Signed)
Labs were due in February .  Patient was seen in November.  I do not see a follow-up appointment to schedule.  Okay to give 30-day supply of methotrexate and advised patient to come in for lab work.

## 2021-01-29 NOTE — Progress Notes (Signed)
Office Visit Note  Patient: Ricky Hardin             Date of Birth: Jan 08, 1966           MRN: [id number]             PCP: Pollyann Savoy, MD Referring: Pollyann Savoy, MD Visit Date: 02/12/2021 Occupation: @GUAROCC @  Subjective:  Medication management.   History of Present Illness: Ricky Hardin is a 55 y.o. male with history of seropositive rheumatoid arthritis.  He states he has been doing well on [name] and methotrexate combination.  He denies any joint pain or joint swelling.  He has made some dietary modifications which is also helped with weight loss and joint inflammation.  He has been on sugar-free and gluten-free diet.  Activities of Daily Living:  Patient reports morning stiffness for 0 minutes.   Patient Denies nocturnal pain.  Difficulty dressing/grooming: Denies Difficulty climbing stairs: Denies Difficulty getting out of chair: Denies Difficulty using hands for taps, buttons, cutlery, and/or writing: Denies  Review of Systems  Constitutional: Negative for fatigue.  HENT: Negative for mouth sores, mouth dryness and nose dryness.   Eyes: Negative for pain, itching and dryness.  Respiratory: Negative for shortness of breath and difficulty breathing.   Cardiovascular: Negative for chest pain and palpitations.  Gastrointestinal: Negative for blood in stool, constipation and diarrhea.  Endocrine: Negative for increased urination.  Genitourinary: Negative for difficulty urinating.  Musculoskeletal: Negative for arthralgias, joint pain, joint swelling, myalgias, morning stiffness, muscle tenderness and myalgias.  Skin: Positive for rash. Negative for color change and redness.  Allergic/Immunologic: Negative for susceptible to infections.  Neurological: Negative for dizziness, numbness, headaches, memory loss and weakness.  Hematological: Negative for bruising/bleeding tendency.  Psychiatric/Behavioral: Negative for confusion.    PMFS History:  Patient Active  Problem List   Diagnosis Date Noted  . RA (rheumatoid arthritis) (HCC) 09/19/2016  . Osteoarthritis of both hands 09/19/2016  . Osteoarthritis of both feet 09/19/2016    Past Medical History:  Diagnosis Date  . Osteoarthritis of both feet 09/19/2016  . Osteoarthritis of both hands 09/19/2016  . RA (rheumatoid arthritis) (HCC) 09/19/2016   +RF, +CCP Erosive disease    Family History  Problem Relation Age of Onset  . Colon polyps Mother   . Heart disease Father 22  . Rheum arthritis Sister   . Colon cancer Neg Hx   . Esophageal cancer Neg Hx   . Stomach cancer Neg Hx   . Rectal cancer Neg Hx    Past Surgical History:  Procedure Laterality Date  . COLONOSCOPY  06/2020   Social History   Social History Narrative  . Not on file    There is no immunization history on file for this patient.   Objective: Vital Signs: BP 119/80 (BP Location: Left Arm, Patient Position: Sitting, Cuff Size: Normal)   Pulse 67   Ht 5\' 10"  (1.778 m)   Wt 179 lb 12.8 oz (81.6 kg)   BMI 25.80 kg/m    Physical Exam Vitals and nursing note reviewed.  Constitutional:      Appearance: He is well-developed.  HENT:     Head: Normocephalic and atraumatic.  Eyes:     Conjunctiva/sclera: Conjunctivae normal.     Pupils: Pupils are equal, round, and reactive to light.  Cardiovascular:     Rate and Rhythm: Normal rate and regular rhythm.     Heart sounds: Normal heart sounds.  Pulmonary:     Effort:  Pulmonary effort is normal.     Breath sounds: Normal breath sounds.  Abdominal:     General: Bowel sounds are normal.     Palpations: Abdomen is soft.  Musculoskeletal:     Cervical back: Normal range of motion and neck supple.  Skin:    General: Skin is warm and dry.     Capillary Refill: Capillary refill takes less than 2 seconds.  Neurological:     Mental Status: He is alert and oriented to person, place, and time.  Psychiatric:        Behavior: Behavior normal.      Musculoskeletal  Exam: C-spine thoracic and lumbar spine were in good range of motion.  Shoulder joints, elbow joints, wrist joints, MCPs PIPs and DIPs with good range of motion with no synovitis.  He has bilateral PIP and DIP thickening due to osteoarthritis.  Hip joints, knee joints, ankles, MTPs and PIPs with good range of motion with no synovitis.  CDAI Exam: CDAI Score: 0.2  Patient Global: 1 mm; Provider Global: 1 mm Swollen: 0 ; Tender: 0  Joint Exam 02/12/2021   No joint exam has been documented for this visit   There is currently no information documented on the homunculus. Go to the Rheumatology activity and complete the homunculus joint exam.  Investigation: No additional findings.  Imaging: No results found.  Recent Labs: Lab Results  Component Value Date   WBC 6.7 09/18/2020   HGB 15.4 09/18/2020   PLT 237 09/18/2020   NA 139 09/18/2020   K 4.0 09/18/2020   CL 106 09/18/2020   CO2 26 09/18/2020   GLUCOSE 112 (H) 09/18/2020   BUN 23 09/18/2020   CREATININE 0.92 09/18/2020   BILITOT 0.8 09/18/2020   ALKPHOS 83 05/05/2017   AST 22 09/18/2020   ALT 23 09/18/2020   PROT 7.0 09/18/2020   ALBUMIN 4.3 05/05/2017   CALCIUM 9.6 09/18/2020   GFRAA 109 09/18/2020   QFTBGOLDPLUS NEGATIVE 04/17/2020    Speciality Comments: No specialty comments available.  Procedures:  No procedures performed Allergies: Patient has no known allergies.   Assessment / Plan:     Visit Diagnoses: Rheumatoid arthritis involving multiple sites with positive rheumatoid factor (HCC)-he had no synovitis on examination.  If he continues to do well then I will consider spacing [name] every 3 weeks at his follow-up visit.  High risk medication use - [name] 40 mg sq every 14 days, MTX 0.7 ml once weekly, and folic acid 2 mg. - Plan: CBC with Differential/Platelet, COMPLETE METABOLIC PANEL WITH GFR, today and then every 3 months.  QuantiFERON-TB Gold Plus with next labs in 3 months.  His labs are past due.  He was  advised to discontinue [name] and methotrexate in case he develops any infection.  He may resume the medications once infection resolves.  Should be getting a skin examination once a year to rule out nonmelanoma skin cancer.  He is not interested in getting COVID-19 vaccination.  He understands the risk of not getting immunization.  Primary osteoarthritis of both hands-joint protection muscle strengthening was discussed.  Primary osteoarthritis of both feet-he is currently asymptomatic.  Family history of heart disease-increased risk of heart disease with rheumatoid arthritis was discussed.  Dietary modifications and exercise was emphasized and the instructions were placed in the AVS.  Orders: Orders Placed This Encounter  Procedures  . CBC with Differential/Platelet  . COMPLETE METABOLIC PANEL WITH GFR  . QuantiFERON-TB Gold Plus   No orders of  the defined types were placed in this encounter.     Follow-Up Instructions: Return in about 5 months (around 07/15/2021) for Rheumatoid arthritis, Osteoarthritis.   Pollyann Savoy, MD  Note - This record has been created using Animal nutritionist.  Chart creation errors have been sought, but may not always  have been located. Such creation errors do not reflect on  the standard of medical care.

## 2021-02-12 ENCOUNTER — Encounter: Payer: Self-pay | Admitting: Rheumatology

## 2021-02-12 ENCOUNTER — Other Ambulatory Visit: Payer: Self-pay

## 2021-02-12 ENCOUNTER — Ambulatory Visit (INDEPENDENT_AMBULATORY_CARE_PROVIDER_SITE_OTHER): Payer: Commercial Managed Care - PPO | Admitting: Rheumatology

## 2021-02-12 VITALS — BP 119/80 | HR 67 | Ht 70.0 in | Wt 179.8 lb

## 2021-02-12 DIAGNOSIS — M19041 Primary osteoarthritis, right hand: Secondary | ICD-10-CM

## 2021-02-12 DIAGNOSIS — Z79899 Other long term (current) drug therapy: Secondary | ICD-10-CM

## 2021-02-12 DIAGNOSIS — M19042 Primary osteoarthritis, left hand: Secondary | ICD-10-CM

## 2021-02-12 DIAGNOSIS — Z8249 Family history of ischemic heart disease and other diseases of the circulatory system: Secondary | ICD-10-CM

## 2021-02-12 DIAGNOSIS — Z131 Encounter for screening for diabetes mellitus: Secondary | ICD-10-CM

## 2021-02-12 DIAGNOSIS — M0579 Rheumatoid arthritis with rheumatoid factor of multiple sites without organ or systems involvement: Secondary | ICD-10-CM

## 2021-02-12 DIAGNOSIS — M19071 Primary osteoarthritis, right ankle and foot: Secondary | ICD-10-CM | POA: Diagnosis not present

## 2021-02-12 DIAGNOSIS — M19072 Primary osteoarthritis, left ankle and foot: Secondary | ICD-10-CM

## 2021-02-12 NOTE — Patient Instructions (Addendum)
Please discontinue methotrexate and [name] if you develop an infection.  You may resume both medications once infection resolves.  Any yearly skin examination by a dermatologist is advised in patients taking [name] to screen for skin cancer.   Standing Labs We placed an order today for your standing lab work.   Please have your standing labs drawn in July and every 3 months  If possible, please have your labs drawn 2 weeks prior to your appointment so that the provider can discuss your results at your appointment.  We have open lab daily Monday through Thursday from 1:30-4:30 PM and Friday from 1:30-4:00 PM at the office of Dr. Pollyann Savoy, [facility].   Please be advised, all patients with office appointments requiring lab work will take precedents over walk-in lab work.  If possible, please come for your lab work on Monday and Friday afternoons, as you may experience shorter wait times. The office is located at [address], [address], [place], [place] [address] No appointment is necessary.   Labs are drawn by [facility]. Please bring your co-pay at the time of your lab draw.  You may receive a bill from [facility] for your lab work.  If you wish to have your labs drawn at another location, please call the office 24 hours in advance to send orders.  If you have any questions regarding directions or hours of operation,  please call [phone].   As a reminder, please drink plenty of water prior to coming for your lab work. Thanks!  COVID-19 vaccine recommendations:   COVID-19 vaccine is recommended for everyone (unless you are allergic to a vaccine component), even if you are on a medication that suppresses your immune system.   If you are on Methotrexate, Cellcept (mycophenolate), Rinvoq, Harriette Ohara, and Olumiant- hold the medication for 1 week after each vaccine. Hold Methotrexate for 2 weeks after the single dose COVID-19 vaccine.   The recommendations are that people  who are taking immunosuppressive agents should receive their first 3 COVID vaccine 1 month apart and a fourth dose 3 months after the third dose.  Do not take Tylenol or any anti-inflammatory medications (NSAIDs) 24 hours prior to the COVID-19 vaccination.   There is no direct evidence about the efficacy of the COVID-19 vaccine in individuals who are on medications that suppress the immune system.   Even if you are fully vaccinated, and you are on any medications that suppress your immune system, please continue to wear a mask, maintain at least six feet social distance and practice hand hygiene.   If you develop a COVID-19 infection, please contact your PCP or our office to determine if you need monoclonal antibody infusion.  The booster vaccine is now available for immunocompromised patients.   Please see the following web sites for updated information.   [email or web address]   Vaccines You are taking a medication(s) that can suppress your immune system.  The following immunizations are recommended: . Flu annually . Covid-19  . Pneumonia (Pneumovax 23 and Prevnar 13 spaced at least 1 year apart) . Shingrix (after age 56)  Please check with your PCP to make sure you are up to date.   Heart Disease Prevention   Your inflammatory disease increases your risk of heart disease which includes heart attack, stroke, atrial fibrillation (irregular heartbeats), high blood pressure, heart failure and atherosclerosis (plaque in the arteries).  It is important to reduce your risk by:   . Keep blood pressure, cholesterol, and blood sugar at  healthy levels   . Smoking Cessation   . Maintain a healthy weight  o BMI 20-25   . Eat a healthy diet  o Plenty of fresh fruit, vegetables, and whole grains  o Limit saturated fats, foods high in sodium, and added sugars  o DASH and Mediterranean diet   . Increase physical activity   o Recommend moderate physically activity for 150 minutes per week/ 30 minutes a day for five days a week These can be broken up into three separate ten-minute sessions during the day.   . Reduce Stress  . Meditation, slow breathing exercises, yoga, coloring books  . Dental visits twice a year

## 2021-02-13 LAB — CBC WITH DIFFERENTIAL/PLATELET
Absolute Monocytes: 714 cells/uL (ref 200–950)
Basophils Absolute: 68 cells/uL (ref 0–200)
Basophils Relative: 1 %
Eosinophils Absolute: 408 cells/uL (ref 15–500)
Eosinophils Relative: 6 %
HCT: 44.8 % (ref 38.5–50.0)
Hemoglobin: 15.1 g/dL (ref 13.2–17.1)
Lymphs Abs: 1952 cells/uL (ref 850–3900)
MCH: 30.5 pg (ref 27.0–33.0)
MCHC: 33.7 g/dL (ref 32.0–36.0)
MCV: 90.5 fL (ref 80.0–100.0)
MPV: 12.2 fL (ref 7.5–12.5)
Monocytes Relative: 10.5 %
Neutro Abs: 3658 cells/uL (ref 1500–7800)
Neutrophils Relative %: 53.8 %
Platelets: 238 10*3/uL (ref 140–400)
RBC: 4.95 10*6/uL (ref 4.20–5.80)
RDW: 13.4 % (ref 11.0–15.0)
Total Lymphocyte: 28.7 %
WBC: 6.8 10*3/uL (ref 3.8–10.8)

## 2021-02-13 LAB — COMPLETE METABOLIC PANEL WITH GFR
AG Ratio: 1.5 (calc) (ref 1.0–2.5)
ALT: 19 U/L (ref 9–46)
AST: 20 U/L (ref 10–35)
Albumin: 4.3 g/dL (ref 3.6–5.1)
Alkaline phosphatase (APISO): 78 U/L (ref 35–144)
BUN/Creatinine Ratio: 26 (calc) — ABNORMAL HIGH (ref 6–22)
BUN: 27 mg/dL — ABNORMAL HIGH (ref 7–25)
CO2: 26 mmol/L (ref 20–32)
Calcium: 9.4 mg/dL (ref 8.6–10.3)
Chloride: 106 mmol/L (ref 98–110)
Creat: 1.02 mg/dL (ref 0.70–1.33)
GFR, Est African American: 95 mL/min/{1.73_m2} (ref >=60)
GFR, Est Non African American: 82 mL/min/{1.73_m2} (ref >=60)
Globulin: 2.9 g/dL (calc) (ref 1.9–3.7)
Glucose, Bld: 97 mg/dL (ref 65–99)
Potassium: 4.3 mmol/L (ref 3.5–5.3)
Sodium: 140 mmol/L (ref 135–146)
Total Bilirubin: 0.8 mg/dL (ref 0.2–1.2)
Total Protein: 7.2 g/dL (ref 6.1–8.1)

## 2021-02-13 NOTE — Progress Notes (Signed)
CBC normal, CMP normal.

## 2021-03-26 ENCOUNTER — Other Ambulatory Visit: Payer: Self-pay | Admitting: Physician Assistant

## 2021-03-26 NOTE — Telephone Encounter (Signed)
Next Visit: 07/16/2021  Last Visit: 02/12/2021  Last Fill: 01/23/2021  DX: Rheumatoid arthritis involving multiple sites with positive rheumatoid factor   Current Dose per office note 02/12/2021, MTX 0.7 ml once weekly  Labs: 02/12/2021, CBC normal, CMP normal.  Okay to refill MTX?

## 2021-06-04 ENCOUNTER — Telehealth: Payer: Self-pay

## 2021-06-04 NOTE — Telephone Encounter (Signed)
Patient left a voicemail stating he is having a "flair up" and requested to speak with Dr. Corliss Skains directly about "what I need to do about it."

## 2021-06-04 NOTE — Telephone Encounter (Signed)
LMOM for patient to return call.

## 2021-06-05 ENCOUNTER — Telehealth: Payer: Self-pay

## 2021-06-05 NOTE — Telephone Encounter (Signed)
Patient has been scheduled for 06/06/2021 at 2:00 pm. Patient advised to notify his ophthalmologist ASAP regarding blurry spot in his right eye. Patient expressed understanding.

## 2021-06-05 NOTE — Telephone Encounter (Signed)
Patient he has been experiencing a flare up in the last few days. Patient states he is having pain in bilateral wrist pain. Patient denies any swelling but  does have warmth. Patient states he is having a blurry spot on right eye. Patient states he is also having discomfort in his toes. Patient is due for [name] today. Patient states he has not missed any doses of [name] or MTX. Patient is on MTX 0.7 mL weekly. Please advise.

## 2021-06-05 NOTE — Telephone Encounter (Signed)
Attempted to contact the patient and left message for patient to call the office.  

## 2021-06-05 NOTE — Telephone Encounter (Signed)
Ok to schedule appt with me tomorrow for further evaluation.  Please advise the patient to notify his ophthalmologist ASAP regarding the blurry spot in his right eye.

## 2021-06-05 NOTE — Telephone Encounter (Signed)
Patient left a voicemail stating he missed the call yesterday.  Patient states he has been dealing with a flair-up and wanted to talk about options.  Patient requested a return call in the morning if possible.

## 2021-06-05 NOTE — Telephone Encounter (Signed)
See previous phone note for details 

## 2021-06-06 ENCOUNTER — Ambulatory Visit (INDEPENDENT_AMBULATORY_CARE_PROVIDER_SITE_OTHER): Payer: Commercial Managed Care - PPO | Admitting: Physician Assistant

## 2021-06-06 ENCOUNTER — Other Ambulatory Visit: Payer: Self-pay

## 2021-06-06 ENCOUNTER — Encounter: Payer: Self-pay | Admitting: Physician Assistant

## 2021-06-06 VITALS — BP 113/73 | HR 63 | Ht 70.0 in | Wt 181.2 lb

## 2021-06-06 DIAGNOSIS — M19041 Primary osteoarthritis, right hand: Secondary | ICD-10-CM | POA: Diagnosis not present

## 2021-06-06 DIAGNOSIS — M19071 Primary osteoarthritis, right ankle and foot: Secondary | ICD-10-CM

## 2021-06-06 DIAGNOSIS — Z8249 Family history of ischemic heart disease and other diseases of the circulatory system: Secondary | ICD-10-CM

## 2021-06-06 DIAGNOSIS — M0579 Rheumatoid arthritis with rheumatoid factor of multiple sites without organ or systems involvement: Secondary | ICD-10-CM

## 2021-06-06 DIAGNOSIS — Z79899 Other long term (current) drug therapy: Secondary | ICD-10-CM | POA: Diagnosis not present

## 2021-06-06 DIAGNOSIS — M19072 Primary osteoarthritis, left ankle and foot: Secondary | ICD-10-CM

## 2021-06-06 DIAGNOSIS — Z111 Encounter for screening for respiratory tuberculosis: Secondary | ICD-10-CM

## 2021-06-06 DIAGNOSIS — M19042 Primary osteoarthritis, left hand: Secondary | ICD-10-CM

## 2021-06-06 MED ORDER — PREDNISONE 5 MG PO TABS: ORAL_TABLET | ORAL | 0 refills | Status: DC

## 2021-06-06 NOTE — Patient Instructions (Signed)
Standing Labs We placed an order today for your standing lab work.   Please have your standing labs drawn in October and every 3 months   If possible, please have your labs drawn 2 weeks prior to your appointment so that the provider can discuss your results at your appointment.  Please note that you may see your imaging and lab results in MyChart before we have reviewed them. We may be awaiting multiple results to interpret others before contacting you. Please allow our office up to 72 hours to thoroughly review all of the results before contacting the office for clarification of your results.  We have open lab daily: Monday through Thursday from 1:30-4:30 PM and Friday from 1:30-4:00 PM at the office of Dr. Shaili Deveshwar, [facility].   Please be advised, all patients with office appointments requiring lab work will take precedent over walk-in lab work.  If possible, please come for your lab work on Monday and Friday afternoons, as you may experience shorter wait times. The office is located at [address], [address], [place], [place] [address] No appointment is necessary.   Labs are drawn by [facility]. Please bring your co-pay at the time of your lab draw.  You may receive a bill from [facility] for your lab work.  If you wish to have your labs drawn at another location, please call the office 24 hours in advance to send orders.  If you have any questions regarding directions or hours of operation,  please call [phone].   As a reminder, please drink plenty of water prior to coming for your lab work. Thanks!  

## 2021-06-06 NOTE — Progress Notes (Signed)
Office Visit Note  Patient: Ricky Hardin             Date of Birth: Jun 15, 1966           MRN: [id number]             PCP: Pollyann Savoy, MD Referring: Pollyann Savoy, MD Visit Date: 06/06/2021 Occupation: @GUAROCC @  Subjective:  Pain in both wrists   History of Present Illness: Ricky Hardin is a 55 y.o. male with history of seropositive rheumatoid arthritis and osteoarthritis.  He is on humira 40 mg sq injections once every 14 days, Methotrexate 0.7 ml sq injections once weekly, and folic acid 2 mg daily.  His last injection of [name] was yesterday and his last dose of methotrexate was about 3 days ago.  He has been experiencing pain and warmth in the left wrist for the past 1 week.  He has also had some increased generalized stiffness and arthralgias.  He denies any change in activity or overuse activities recently.  He has not been taking any over-the-counter products for pain relief.  He tried wearing a left wrist brace at night to help with his nocturnal pain.  He states his left wrist pain is currently at 6/10.       Activities of Daily Living:  Patient reports morning stiffness for 0 minutes.   Patient Reports nocturnal pain.  Difficulty dressing/grooming: Denies Difficulty climbing stairs: Denies Difficulty getting out of chair: Denies Difficulty using hands for taps, buttons, cutlery, and/or writing: Reports  Review of Systems  Constitutional:  Negative for fatigue.  HENT:  Positive for nosebleeds. Negative for mouth sores, mouth dryness and nose dryness.   Eyes:  Positive for dryness. Negative for pain and itching.  Respiratory:  Negative for shortness of breath and difficulty breathing.   Cardiovascular:  Negative for chest pain and palpitations.  Gastrointestinal:  Negative for blood in stool, constipation and diarrhea.  Endocrine: Negative for increased urination.  Genitourinary:  Negative for difficulty urinating.  Musculoskeletal:  Positive for joint pain  and joint pain. Negative for joint swelling, myalgias, morning stiffness, muscle tenderness and myalgias.  Skin:  Negative for color change, rash and redness.  Allergic/Immunologic: Negative for susceptible to infections.  Neurological:  Negative for dizziness, numbness, headaches, memory loss and weakness.  Hematological:  Negative for bruising/bleeding tendency.  Psychiatric/Behavioral:  Negative for confusion.    PMFS History:  Patient Active Problem List   Diagnosis Date Noted   RA (rheumatoid arthritis) (HCC) 09/19/2016   Osteoarthritis of both hands 09/19/2016   Osteoarthritis of both feet 09/19/2016    Past Medical History:  Diagnosis Date   Osteoarthritis of both feet 09/19/2016   Osteoarthritis of both hands 09/19/2016   RA (rheumatoid arthritis) (HCC) 09/19/2016   +RF, +CCP Erosive disease    Family History  Problem Relation Age of Onset   Colon polyps Mother    Heart disease Father 2   Rheum arthritis Sister    Colon cancer Neg Hx    Esophageal cancer Neg Hx    Stomach cancer Neg Hx    Rectal cancer Neg Hx    Past Surgical History:  Procedure Laterality Date   COLONOSCOPY  06/2020   Social History   Social History Narrative   Not on file    There is no immunization history on file for this patient.   Objective: Vital Signs: BP 113/73 (BP Location: Left Arm, Patient Position: Sitting, Cuff Size: Normal)   Pulse 63  Ht 5\' 10"  (1.778 m)   Wt 181 lb 3.2 oz (82.2 kg)   BMI 26.00 kg/m    Physical Exam Vitals and nursing note reviewed.  Constitutional:      Appearance: He is well-developed.  HENT:     Head: Normocephalic and atraumatic.  Eyes:     Conjunctiva/sclera: Conjunctivae normal.     Pupils: Pupils are equal, round, and reactive to light.  Pulmonary:     Effort: Pulmonary effort is normal.  Abdominal:     Palpations: Abdomen is soft.  Musculoskeletal:     Cervical back: Normal range of motion and neck supple.  Skin:    General: Skin is  warm and dry.     Capillary Refill: Capillary refill takes less than 2 seconds.  Neurological:     Mental Status: He is alert and oriented to person, place, and time.  Psychiatric:        Behavior: Behavior normal.     Musculoskeletal Exam: C-spine has limited ROM with lateral rotation.  Thoracic and lumbar spine good ROM.  Shoulder joints and elbow joints good ROM with no discomfort.  Painful and limited extension of the left wrist.  Tenderness over the dorsal aspect of the right wrist with warmth noted.  Mild radial styloid tenosynovitis noted.  No tenderness or synovitis over MCP joints.  Complete fist formation bilaterally.  Hip joints good ROM with no discomfort.  Knee joints good ROM with no discomfort.  No warmth or effusion of knee joints.  No tenderness or swelling of ankle joints.   CDAI Exam: CDAI Score: -- Patient Global: --; Provider Global: -- Swollen: --; Tender: -- Joint Exam 06/06/2021   No joint exam has been documented for this visit   There is currently no information documented on the homunculus. Go to the Rheumatology activity and complete the homunculus joint exam.  Investigation: No additional findings.  Imaging: No results found.  Recent Labs: Lab Results  Component Value Date   WBC 6.8 02/12/2021   HGB 15.1 02/12/2021   PLT 238 02/12/2021   NA 140 02/12/2021   K 4.3 02/12/2021   CL 106 02/12/2021   CO2 26 02/12/2021   GLUCOSE 97 02/12/2021   BUN 27 (H) 02/12/2021   CREATININE 1.02 02/12/2021   BILITOT 0.8 02/12/2021   ALKPHOS 83 05/05/2017   AST 20 02/12/2021   ALT 19 02/12/2021   PROT 7.2 02/12/2021   ALBUMIN 4.3 05/05/2017   CALCIUM 9.4 02/12/2021   GFRAA 95 02/12/2021   QFTBGOLDPLUS NEGATIVE 04/17/2020    Speciality Comments: No specialty comments available.  Procedures:  No procedures performed Allergies: Patient has no known allergies.   Assessment / Plan:     Visit Diagnoses: Rheumatoid arthritis involving multiple sites with  positive rheumatoid factor (HCC) - +RF, +anti-CCP, erosive disease: He presents today with mild radial styloid tenosynovitis of the left wrist and tenderness over the dorsal aspect of the left wrist.  He started to experience increased pain in both wrists especially the left wrist starting about 1 week ago without any overuse activites or injury prior to the onset of symptoms.  He initially was experiencing a severe throbbing sensation in the left wrist especially at night.  He tried wearing a brace at night for support but has not tried taking any over-the-counter products for pain relief.  According to the patient he previously tried spacing the dose of [name] to every 21 days and occasionally would space his methotrexate dose by few extra  days.  His most recent [name] injection was 2 days ago and his methotrexate dose was about 3 days ago.  He has started to notice some improvement in his symptoms since performing both injections.  We discussed going back to injecting [name] every 14 days and methotrexate 0.7 mL sq injections once weekly.  We discussed increasing the dose of methotrexate to 0.8 but he would like to hold off at this time due to having increased headaches on the higher dose of methotrexate.  A prednisone taper starting at 20 mg tapering by 5 mg every 4 days was sent to the pharmacy today.  He was advised to notify us if he continues to have increased joint pain and joint swelling.  He will follow-up in the office in 3 to 4 months.  X-rays of both hands and feet will be updated at his next office visit to assess for radiographic progression.- Plan: Sedimentation rate  High risk medication use - [name] 40 mg sq every 14 days, MTX 0.7 ml once weekly, and folic acid 2 mg.CBC and CMP drawn on 02/12/2021.  He is overdue to update lab work.  Orders for CBC and CMP were released today.  His next lab work will be due in October and every 3 months to monitor for drug toxicity.  Standing orders for CBC and  CMP remain in place.  TB Gold negative on 04/17/2020.  Order for TB gold released today.- Plan: COMPLETE METABOLIC PANEL WITH GFR, CBC with Differential/Platelet, QuantiFERON-TB Gold Plus, CBC with Differential/Platelet, COMPLETE METABOLIC PANEL WITH GFR He has not had any recent infections.  He was advised to hold [name] and methotrexate if he develops signs or symptoms of an infection and to resume once the infection has completely cleared.   Primary osteoarthritis of both hands: He has PIP and DIP thickening consistent with osteoarthritis of both hands.  He has been experiencing increased pain in both wrists especially the left wrist. A prednisone taper was sent to the pharmacy.   Primary osteoarthritis of both feet: He has been experiencing some increased pain and stiffness in both feet but has not noticed any inflammation.  He has good range of motion of both ankle joints on examination today with no tenderness or synovitis.  Family history of heart disease  Screening for tuberculosis - Order for TB gold released. Plan: QuantiFERON-TB Gold Plus  Orders: Orders Placed This Encounter  Procedures   COMPLETE METABOLIC PANEL WITH GFR   CBC with Differential/Platelet   QuantiFERON-TB Gold Plus   CBC with Differential/Platelet   COMPLETE METABOLIC PANEL WITH GFR   Sedimentation rate   Meds ordered this encounter  Medications   predniSONE (DELTASONE) 5 MG tablet    Sig: Take 4 tablets by mouth daily x4 days, 3 tablets by mouth daily x4 days, 2 tablets by mouth daily x4 days, 1 tablet by mouth daily x4 days.    Dispense:  40 tablet    Refill:  0     Follow-Up Instructions: Return in about 4 months (around 10/07/2021) for Rheumatoid arthritis, Osteoarthritis.   Gearldine Bienenstock, PA-C  Note - This record has been created using Dragon software.  Chart creation errors have been sought, but may not always  have been located. Such creation errors do not reflect on  the standard of medical  care.

## 2021-06-07 NOTE — Progress Notes (Signed)
CBC and CMP WNL. ESR WNL.

## 2021-06-09 LAB — COMPLETE METABOLIC PANEL WITH GFR
AG Ratio: 1.3 (calc) (ref 1.0–2.5)
ALT: 19 U/L (ref 9–46)
AST: 17 U/L (ref 10–35)
Albumin: 4.2 g/dL (ref 3.6–5.1)
Alkaline phosphatase (APISO): 77 U/L (ref 35–144)
BUN: 25 mg/dL (ref 7–25)
CO2: 26 mmol/L (ref 20–32)
Calcium: 9.6 mg/dL (ref 8.6–10.3)
Chloride: 105 mmol/L (ref 98–110)
Creat: 0.88 mg/dL (ref 0.70–1.30)
Globulin: 3.2 g/dL (calc) (ref 1.9–3.7)
Glucose, Bld: 87 mg/dL (ref 65–99)
Potassium: 4.3 mmol/L (ref 3.5–5.3)
Sodium: 140 mmol/L (ref 135–146)
Total Bilirubin: 0.9 mg/dL (ref 0.2–1.2)
Total Protein: 7.4 g/dL (ref 6.1–8.1)
eGFR: 102 mL/min/{1.73_m2} (ref >=60)

## 2021-06-09 LAB — QUANTIFERON-TB GOLD PLUS
Mitogen-NIL: >10 IU/mL
NIL: 0.02 IU/mL
QuantiFERON-TB Gold Plus: NEGATIVE
TB1-NIL: 0.01 IU/mL
TB2-NIL: 0 IU/mL

## 2021-06-09 LAB — CBC WITH DIFFERENTIAL/PLATELET
Absolute Monocytes: 765 cells/uL (ref 200–950)
Basophils Absolute: 60 cells/uL (ref 0–200)
Basophils Relative: 0.8 %
Eosinophils Absolute: 285 cells/uL (ref 15–500)
Eosinophils Relative: 3.8 %
HCT: 45.4 % (ref 38.5–50.0)
Hemoglobin: 15.5 g/dL (ref 13.2–17.1)
Lymphs Abs: 2198 cells/uL (ref 850–3900)
MCH: 30.3 pg (ref 27.0–33.0)
MCHC: 34.1 g/dL (ref 32.0–36.0)
MCV: 88.8 fL (ref 80.0–100.0)
MPV: 12 fL (ref 7.5–12.5)
Monocytes Relative: 10.2 %
Neutro Abs: 4193 cells/uL (ref 1500–7800)
Neutrophils Relative %: 55.9 %
Platelets: 241 10*3/uL (ref 140–400)
RBC: 5.11 10*6/uL (ref 4.20–5.80)
RDW: 13 % (ref 11.0–15.0)
Total Lymphocyte: 29.3 %
WBC: 7.5 10*3/uL (ref 3.8–10.8)

## 2021-06-09 LAB — SEDIMENTATION RATE: Sed Rate: 6 mm/h (ref 0–20)

## 2021-06-10 NOTE — Progress Notes (Signed)
TB gold negative

## 2021-06-11 ENCOUNTER — Other Ambulatory Visit: Payer: Self-pay | Admitting: Rheumatology

## 2021-06-11 NOTE — Telephone Encounter (Signed)
Next Visit: 09/19/2021  Last Visit: 06/06/2021  Last Fill: 03/26/2021  DX: Rheumatoid arthritis involving multiple sites with positive rheumatoid factor  Current Dose per office note 06/06/2021: MTX 0.7 ml once weekly  Labs: 06/06/2021, CBC and CMP WNL.  ESR WNL.  Okay to refill MTX?

## 2021-06-25 ENCOUNTER — Other Ambulatory Visit: Payer: Self-pay | Admitting: Rheumatology

## 2021-06-25 DIAGNOSIS — M0579 Rheumatoid arthritis with rheumatoid factor of multiple sites without organ or systems involvement: Secondary | ICD-10-CM

## 2021-06-25 NOTE — Telephone Encounter (Signed)
Next Visit: 09/19/2021   Last Visit: 06/06/2021   Last Fill: 10/12/2020    Rheumatoid arthritis involving multiple sites with positive rheumatoid factor   Current Dose per office note 06/06/2021: [name] 40 mg sq every 14 days  Labs: 06/06/2021, CBC and CMP WNL.  ESR WNL.  TB Gold: 06/06/2021 

## 2021-07-16 ENCOUNTER — Ambulatory Visit: Payer: Commercial Managed Care - PPO | Admitting: Physician Assistant

## 2021-08-27 ENCOUNTER — Other Ambulatory Visit (HOSPITAL_COMMUNITY): Payer: Self-pay

## 2021-08-27 ENCOUNTER — Telehealth: Payer: Self-pay

## 2021-08-27 NOTE — Telephone Encounter (Signed)
Patient called stating his specialty pharmacy has changed to [facility] and is still working with his insurance company for his [name] refill.  Patient states he is out of medication and is checking if there is a sample at the office he could pick up.

## 2021-08-27 NOTE — Telephone Encounter (Addendum)
ATC patient for new insurance information. Phone went straight to VM. Left VM requesting return call with new insurance information.  Called [employer] [phone]) to initiate prior authorization however per rep, all biologics are plan exclusion. Per rep, patient must call [phone] to review patient assistance since his plan will not cover [name]. Rep stated that they grandfather authorization in but medication is still not covered, and rep was unable to clarify what this meant since it seemed contradictory that Berkley Harvey is grandfathered in from previous plan but is not covered  Insurance info: BIN: [id number] PCN: [id number] Group: [id number] ID: [id number]  Provider portion of [employer] PAP application placed in provider folder to be signed.  Chesley Mires, PharmD, MPH, BCPS Clinical Pharmacist (Rheumatology and Pulmonology)

## 2021-08-28 NOTE — Telephone Encounter (Signed)
Patient returned call to clinic. He will plan to complete application when he picks up [name] sample today. He has been advised to bring his most recent tax return to submit with application.  He will stop by before noon today, 08/28/21  Medication sample labeled in the refrigerator but not yet logged out in refrigerator.  Drug name: [name] 40mg /0.24mL autoinjector Qty: 1 pen LOT: 11m Exp.Date: 10/2022  Drug name: [name] 40mg /0.42mL autoinjector Qty: 1 pen LOT: Exp.Date: 09/2022  The patient has been instructed regarding the correct time, dose, and frequency of taking this medication, including desired effects and most common side effects.   [id number], PharmD, MPH, BCPS Clinical Pharmacist (Rheumatology and Pulmonology)

## 2021-08-28 NOTE — Telephone Encounter (Signed)
Patient picked up [name] samples from clinic and completed [employer] PAP application for [name]. He also dropped off income documents.  Provider portion already signed. Will need letter signed by provider prior to submitting to [employer]  Samples logged out in binder.  Chesley Mires, PharmD, MPH, BCPS Clinical Pharmacist (Rheumatology and Pulmonology)

## 2021-08-28 NOTE — Telephone Encounter (Signed)
Received signed provider portion of [employer] PAP application for [name]. Will place in "PAP pending info" folder in pharmacy office and await patient to notify us / provide [place] with his forms.  Chesley Mires, PharmD, MPH, BCPS Clinical Pharmacist (Rheumatology and Pulmonology)

## 2021-08-28 NOTE — Telephone Encounter (Signed)
Submitted Patient Assistance Application to [employer] for [name] along with provider portion, patient portion, letter attesting that patient's insurance does not cover any biologics, insurance info, med list, and income documents. Will update patient when we receive a response.  Fax# [phone] Phone# [phone]  Chesley Mires, PharmD, MPH, BCPS Clinical Pharmacist (Rheumatology and Pulmonology)

## 2021-09-06 ENCOUNTER — Other Ambulatory Visit (HOSPITAL_COMMUNITY): Payer: Self-pay

## 2021-09-06 ENCOUNTER — Other Ambulatory Visit: Payer: Self-pay | Admitting: Physician Assistant

## 2021-09-06 NOTE — Telephone Encounter (Signed)
Next Visit: 09/19/2021   Last Visit: 06/06/2021   Last Fill: 06/11/2021  Dx: Rheumatoid arthritis involving multiple sites with positive rheumatoid factor   Current Dose per office note 06/06/2021:  Labs: 06/06/2021, CBC and CMP WNL.  ESR WNL.  Left message to advise patient he is due to update labs.   Okay to refill MTX?

## 2021-09-06 NOTE — Telephone Encounter (Signed)
Called [employer] for update on patient's [name] PAP application. Rep said review of application will take 1-2 additional business days.  Phone: [phone]   Chesley Mires, PharmD, MPH, BCPS Clinical Pharmacist (Rheumatology and Pulmonology)

## 2021-09-06 NOTE — Progress Notes (Signed)
Office Visit Note  Patient: Ricky Hardin             Date of Birth: 02-Sep-1966           MRN: [id number]             PCP: Pollyann Savoy, MD Referring: Pollyann Savoy, MD Visit Date: 09/19/2021 Occupation: @GUAROCC @  Subjective:  Medication management.   History of Present Illness: Ricky Hardin is a 55 y.o. male with history of seropositive rheumatoid arthritis and osteoarthritis.  He states he continues to have some stiffness in his hands.  He has not noticed any joint swelling.  Has been tolerating methotrexate and [name] without any difficulty.  He denies any joint swelling.  He states he had some lower back pain 3 days ago which improved after doing some stretching exercises.  Activities of Daily Living:  Patient reports morning stiffness for a few minutes.   Patient Denies nocturnal pain.  Difficulty dressing/grooming: Denies Difficulty climbing stairs: Denies Difficulty getting out of chair: Denies Difficulty using hands for taps, buttons, cutlery, and/or writing: Reports  Review of Systems  Constitutional:  Negative for fatigue.  HENT:  Negative for mouth sores, mouth dryness and nose dryness.   Eyes:  Negative for pain, itching and dryness.  Respiratory:  Negative for shortness of breath and difficulty breathing.   Cardiovascular:  Negative for chest pain and palpitations.  Gastrointestinal:  Negative for blood in stool, constipation and diarrhea.  Endocrine: Negative for increased urination.  Genitourinary:  Negative for difficulty urinating.  Musculoskeletal:  Positive for joint pain, joint pain and morning stiffness. Negative for joint swelling, myalgias, muscle tenderness and myalgias.  Skin:  Negative for color change, rash and redness.  Allergic/Immunologic: Negative for susceptible to infections.  Neurological:  Positive for headaches. Negative for dizziness, numbness, memory loss and weakness.  Hematological:  Negative for bruising/bleeding tendency.   Psychiatric/Behavioral:  Negative for confusion.    PMFS History:  Patient Active Problem List   Diagnosis Date Noted   RA (rheumatoid arthritis) (HCC) 09/19/2016   Osteoarthritis of both hands 09/19/2016   Osteoarthritis of both feet 09/19/2016    Past Medical History:  Diagnosis Date   Osteoarthritis of both feet 09/19/2016   Osteoarthritis of both hands 09/19/2016   RA (rheumatoid arthritis) (HCC) 09/19/2016   +RF, +CCP Erosive disease    Family History  Problem Relation Age of Onset   Colon polyps Mother    Heart disease Father 44   Rheum arthritis Sister    Colon cancer Neg Hx    Esophageal cancer Neg Hx    Stomach cancer Neg Hx    Rectal cancer Neg Hx    Past Surgical History:  Procedure Laterality Date   COLONOSCOPY  06/2020   Social History   Social History Narrative   Not on file    There is no immunization history on file for this patient.   Objective: Vital Signs: BP 124/77 (BP Location: Left Arm, Patient Position: Sitting, Cuff Size: Normal)   Pulse 63   Ht 5\' 10"  (1.778 m)   Wt 178 lb 9.6 oz (81 kg)   BMI 25.63 kg/m    Physical Exam Vitals and nursing note reviewed.  Constitutional:      Appearance: He is well-developed.  HENT:     Head: Normocephalic and atraumatic.  Eyes:     Conjunctiva/sclera: Conjunctivae normal.     Pupils: Pupils are equal, round, and reactive to light.  Cardiovascular:  Rate and Rhythm: Normal rate and regular rhythm.     Heart sounds: Normal heart sounds.  Pulmonary:     Effort: Pulmonary effort is normal.     Breath sounds: Normal breath sounds.  Abdominal:     General: Bowel sounds are normal.     Palpations: Abdomen is soft.  Musculoskeletal:     Cervical back: Normal range of motion and neck supple.  Skin:    General: Skin is warm and dry.     Capillary Refill: Capillary refill takes less than 2 seconds.  Neurological:     Mental Status: He is alert and oriented to person, place, and time.   Psychiatric:        Behavior: Behavior normal.     Musculoskeletal Exam: C-spine thoracic and lumbar spine were in good range of motion.  Shoulder joints, elbow joints, wrist joints, MCPs PIPs and DIPs with good range of motion with no synovitis.  He had bilateral PIP and DIP thickening.  Hip joints, knee joints, and ankles were in good range of motion with no synovitis.  There was no tenderness over MTPs or PIPs.    CDAI Exam: CDAI Score: 0.4  Patient Global: 2 mm; Provider Global: 2 mm Swollen: 0 ; Tender: 0  Joint Exam 09/19/2021   No joint exam has been documented for this visit   There is currently no information documented on the homunculus. Go to the Rheumatology activity and complete the homunculus joint exam.  Investigation: No additional findings.  Imaging: No results found.  Recent Labs: Lab Results  Component Value Date   WBC 7.5 06/06/2021   HGB 15.5 06/06/2021   PLT 241 06/06/2021   NA 140 06/06/2021   K 4.3 06/06/2021   CL 105 06/06/2021   CO2 26 06/06/2021   GLUCOSE 87 06/06/2021   BUN 25 06/06/2021   CREATININE 0.88 06/06/2021   BILITOT 0.9 06/06/2021   ALKPHOS 83 05/05/2017   AST 17 06/06/2021   ALT 19 06/06/2021   PROT 7.4 06/06/2021   ALBUMIN 4.3 05/05/2017   CALCIUM 9.6 06/06/2021   GFRAA 95 02/12/2021   QFTBGOLDPLUS NEGATIVE 06/06/2021    Speciality Comments: No specialty comments available.  Procedures:  No procedures performed Allergies: Patient has no known allergies.   Assessment / Plan:     Visit Diagnoses: Rheumatoid arthritis involving multiple sites with positive rheumatoid factor (HCC) - +RF, +anti-CCP, erosive disease: Patient had no synovitis on examination.  He has been tolerating medications well.  High risk medication use - [name] 40 mg sq every 14 days, MTX 0.7 ml once weekly, and folic acid 2 mg -labs obtained on June 06, 2021 CBC and CMP were within normal limits which were reviewed.  Last TB gold was on June 06, 2021  which was negative.  Plan: CBC with Differential/Platelet, COMPLETE METABOLIC PANEL WITH GFR today and then every 3 months to monitor for drug toxicity.  He has been advised to stop methotrexate and [name] in case he develops an infection and resume after the infection resolves.  Information regarding immunization was placed in the AVS.  He was also advised to have annual skin examination to screen for skin cancer.  Wynelle Link protection was advised.  Primary osteoarthritis of both hands-he had bilateral PIP and DIP thickening consistent with osteoarthritis.  Joint protection muscle strengthening was discussed.  Primary osteoarthritis of both feet-he denied any discomfort today.  Family history of heart disease  Orders: Orders Placed This Encounter  Procedures  CBC with Differential/Platelet   COMPLETE METABOLIC PANEL WITH GFR   No orders of the defined types were placed in this encounter.    Follow-Up Instructions: Return in about 5 months (around 02/17/2022) for Rheumatoid arthritis.   Pollyann Savoy, MD  Note - This record has been created using Animal nutritionist.  Chart creation errors have been sought, but may not always  have been located. Such creation errors do not reflect on  the standard of medical care.

## 2021-09-11 NOTE — Telephone Encounter (Signed)
Received a fax from  [employer] regarding an approval for [name] patient assistance from 09/10/21 to 09/10/22.   Phone number: [phone]  ATC patient to review but unable to reach. Left VM requesting return call  Chesley Mires, PharmD, MPH, BCPS Clinical Pharmacist (Rheumatology and Pulmonology)

## 2021-09-12 NOTE — Telephone Encounter (Signed)
Patient returning your call. Please call when available.

## 2021-09-12 NOTE — Telephone Encounter (Signed)
Returned call to patient and recommended he reach out to [employer] or early next week to schedule [name] shipment.  Provided patient with [employer] phone number and advised they will send renewal application to him next year. He verbalized understanding.  Chesley Mires, PharmD, MPH, BCPS Clinical Pharmacist (Rheumatology and Pulmonology)

## 2021-09-19 ENCOUNTER — Encounter: Payer: Self-pay | Admitting: Rheumatology

## 2021-09-19 ENCOUNTER — Other Ambulatory Visit: Payer: Self-pay

## 2021-09-19 ENCOUNTER — Ambulatory Visit (INDEPENDENT_AMBULATORY_CARE_PROVIDER_SITE_OTHER): Payer: Commercial Managed Care - PPO | Admitting: Rheumatology

## 2021-09-19 VITALS — BP 124/77 | HR 63 | Ht 70.0 in | Wt 178.6 lb

## 2021-09-19 DIAGNOSIS — Z79899 Other long term (current) drug therapy: Secondary | ICD-10-CM

## 2021-09-19 DIAGNOSIS — M19042 Primary osteoarthritis, left hand: Secondary | ICD-10-CM

## 2021-09-19 DIAGNOSIS — M19041 Primary osteoarthritis, right hand: Secondary | ICD-10-CM

## 2021-09-19 DIAGNOSIS — M0579 Rheumatoid arthritis with rheumatoid factor of multiple sites without organ or systems involvement: Secondary | ICD-10-CM | POA: Diagnosis not present

## 2021-09-19 DIAGNOSIS — M19072 Primary osteoarthritis, left ankle and foot: Secondary | ICD-10-CM

## 2021-09-19 DIAGNOSIS — M19071 Primary osteoarthritis, right ankle and foot: Secondary | ICD-10-CM

## 2021-09-19 DIAGNOSIS — Z8249 Family history of ischemic heart disease and other diseases of the circulatory system: Secondary | ICD-10-CM

## 2021-09-19 NOTE — Patient Instructions (Signed)
Standing Labs We placed an order today for your standing lab work.   Please have your standing labs drawn in February and every 3 months  If possible, please have your labs drawn 2 weeks prior to your appointment so that the provider can discuss your results at your appointment.  Please note that you may see your imaging and lab results in MyChart before we have reviewed them. We may be awaiting multiple results to interpret others before contacting you. Please allow our office up to 72 hours to thoroughly review all of the results before contacting the office for clarification of your results.  We have open lab daily: Monday through Thursday from 1:30-4:30 PM and Friday from 1:30-4:00 PM at the office of Dr. Pollyann Savoy, [facility].   Please be advised, all patients with office appointments requiring lab work will take precedent over walk-in lab work.  If possible, please come for your lab work on Monday and Friday afternoons, as you may experience shorter wait times. The office is located at [address], [place], [place] [address] No appointment is necessary.   Labs are drawn by [facility]. Please bring your co-pay at the time of your lab draw.  You may receive a bill from [facility] for your lab work.  If you wish to have your labs drawn at another location, please call the office 24 hours in advance to send orders.  If you have any questions regarding directions or hours of operation,  please call [phone].   As a reminder, please drink plenty of water prior to coming for your lab work. Thanks!   Vaccines You are taking a medication(s) that can suppress your immune system.  The following immunizations are recommended: Flu annually Covid-19  Td/Tdap (tetanus, diphtheria, pertussis) every 10 years Pneumonia (Prevnar 15 then Pneumovax 23 at least 1 year apart.  Alternatively, can take Prevnar 20 without needing additional dose) Shingrix: 2 doses from 4  weeks to 6 months apart  Please check with your PCP to make sure you are up to date.   If you have signs or symptoms of an infection or start antibiotics: First, call your PCP for workup of your infection. Hold your medication through the infection, until you complete your antibiotics, and until symptoms resolve if you take the following: Injectable medication (Actemra, Benlysta, Cimzia, Cosentyx, Enbrel, [name], Kevzara, Orencia, Remicade, Simponi, Stelara, Taltz, Tremfya) Methotrexate Leflunomide (Arava) Mycophenolate (Cellcept) Osborne [place], or Rinvoq   An annual skin examination is recommended by a dermatologist to screen for skin cancer while you are taking [name]

## 2021-09-20 LAB — CBC WITH DIFFERENTIAL/PLATELET
Absolute Monocytes: 773 cells/uL (ref 200–950)
Basophils Absolute: 60 cells/uL (ref 0–200)
Basophils Relative: 0.8 %
Eosinophils Absolute: 270 cells/uL (ref 15–500)
Eosinophils Relative: 3.6 %
HCT: 46.7 % (ref 38.5–50.0)
Hemoglobin: 15.9 g/dL (ref 13.2–17.1)
Lymphs Abs: 2408 cells/uL (ref 850–3900)
MCH: 30.8 pg (ref 27.0–33.0)
MCHC: 34 g/dL (ref 32.0–36.0)
MCV: 90.3 fL (ref 80.0–100.0)
MPV: 12.5 fL (ref 7.5–12.5)
Monocytes Relative: 10.3 %
Neutro Abs: 3990 cells/uL (ref 1500–7800)
Neutrophils Relative %: 53.2 %
Platelets: 249 10*3/uL (ref 140–400)
RBC: 5.17 10*6/uL (ref 4.20–5.80)
RDW: 13 % (ref 11.0–15.0)
Total Lymphocyte: 32.1 %
WBC: 7.5 10*3/uL (ref 3.8–10.8)

## 2021-09-20 LAB — COMPLETE METABOLIC PANEL WITH GFR
AG Ratio: 1.5 (calc) (ref 1.0–2.5)
ALT: 22 U/L (ref 9–46)
AST: 21 U/L (ref 10–35)
Albumin: 4.5 g/dL (ref 3.6–5.1)
Alkaline phosphatase (APISO): 88 U/L (ref 35–144)
BUN: 25 mg/dL (ref 7–25)
CO2: 22 mmol/L (ref 20–32)
Calcium: 9.6 mg/dL (ref 8.6–10.3)
Chloride: 105 mmol/L (ref 98–110)
Creat: 0.92 mg/dL (ref 0.70–1.30)
Globulin: 3.1 g/dL (calc) (ref 1.9–3.7)
Glucose, Bld: 89 mg/dL (ref 65–99)
Potassium: 4.2 mmol/L (ref 3.5–5.3)
Sodium: 138 mmol/L (ref 135–146)
Total Bilirubin: 0.8 mg/dL (ref 0.2–1.2)
Total Protein: 7.6 g/dL (ref 6.1–8.1)
eGFR: 98 mL/min/{1.73_m2} (ref >=60)

## 2021-09-20 NOTE — Progress Notes (Signed)
CBC and CMP are normal.

## 2021-12-20 ENCOUNTER — Telehealth: Payer: Self-pay

## 2021-12-20 NOTE — Telephone Encounter (Signed)
Patient called requesting prescription refill of [name] (3 month supply) to be sent to [facility].  Patient states he only has 1 injection remaining.

## 2021-12-25 ENCOUNTER — Other Ambulatory Visit: Payer: Self-pay | Admitting: *Deleted

## 2021-12-25 DIAGNOSIS — M0579 Rheumatoid arthritis with rheumatoid factor of multiple sites without organ or systems involvement: Secondary | ICD-10-CM

## 2021-12-25 MED ORDER — HUMIRA (2 PEN) 40 MG/0.4ML ~~LOC~~ AJKT: AUTO-INJECTOR | SUBCUTANEOUS | 0 refills | Status: DC

## 2021-12-25 NOTE — Telephone Encounter (Signed)
Left message to advise patient the refill request is in the provider's inbox to be signed off. Patient advised he is due to update labs and we will only be able to provide a 30 day supply until they are updated.

## 2021-12-25 NOTE — Telephone Encounter (Signed)
Patient called to check the status of his prescription refill of [name].  Patient requested a return call.

## 2021-12-25 NOTE — Telephone Encounter (Signed)
Refill request received via fax  Next Visit: 02/20/2022  Last Visit: 09/19/2021  Last Fill: 06/25/2021  BJ:SEGBTDVVOH arthritis involving multiple sites with positive rheumatoid factor   Current Dose per office note 09/19/2021: [name] 40 mg sq every 14 days  Labs: 09/19/2021 CBC and CMP are normal.  TB Gold: 06/06/2021   Left message to advise patient he is due to update labs.   Okay to refill [name]?

## 2021-12-27 ENCOUNTER — Other Ambulatory Visit: Payer: Self-pay | Admitting: *Deleted

## 2021-12-27 DIAGNOSIS — Z79899 Other long term (current) drug therapy: Secondary | ICD-10-CM

## 2021-12-28 LAB — CBC WITH DIFFERENTIAL/PLATELET
Absolute Monocytes: 722 cells/uL (ref 200–950)
Basophils Absolute: 72 cells/uL (ref 0–200)
Basophils Relative: 1.1 %
Eosinophils Absolute: 221 cells/uL (ref 15–500)
Eosinophils Relative: 3.4 %
HCT: 44.2 % (ref 38.5–50.0)
Hemoglobin: 14.9 g/dL (ref 13.2–17.1)
Lymphs Abs: 2360 cells/uL (ref 850–3900)
MCH: 30.3 pg (ref 27.0–33.0)
MCHC: 33.7 g/dL (ref 32.0–36.0)
MCV: 89.8 fL (ref 80.0–100.0)
MPV: 12.4 fL (ref 7.5–12.5)
Monocytes Relative: 11.1 %
Neutro Abs: 3127 cells/uL (ref 1500–7800)
Neutrophils Relative %: 48.1 %
Platelets: 252 10*3/uL (ref 140–400)
RBC: 4.92 10*6/uL (ref 4.20–5.80)
RDW: 13 % (ref 11.0–15.0)
Total Lymphocyte: 36.3 %
WBC: 6.5 10*3/uL (ref 3.8–10.8)

## 2021-12-28 LAB — COMPLETE METABOLIC PANEL WITH GFR
AG Ratio: 1.4 (calc) (ref 1.0–2.5)
ALT: 19 U/L (ref 9–46)
AST: 19 U/L (ref 10–35)
Albumin: 4 g/dL (ref 3.6–5.1)
Alkaline phosphatase (APISO): 76 U/L (ref 35–144)
BUN: 20 mg/dL (ref 7–25)
CO2: 26 mmol/L (ref 20–32)
Calcium: 9.4 mg/dL (ref 8.6–10.3)
Chloride: 106 mmol/L (ref 98–110)
Creat: 0.92 mg/dL (ref 0.70–1.30)
Globulin: 2.9 g/dL (calc) (ref 1.9–3.7)
Glucose, Bld: 85 mg/dL (ref 65–99)
Potassium: 4.2 mmol/L (ref 3.5–5.3)
Sodium: 139 mmol/L (ref 135–146)
Total Bilirubin: 0.9 mg/dL (ref 0.2–1.2)
Total Protein: 6.9 g/dL (ref 6.1–8.1)
eGFR: 98 mL/min/{1.73_m2} (ref >=60)

## 2021-12-30 NOTE — Progress Notes (Signed)
CBC and CMP WNL

## 2022-01-13 ENCOUNTER — Other Ambulatory Visit: Payer: Self-pay | Admitting: Rheumatology

## 2022-01-13 NOTE — Telephone Encounter (Signed)
Next Visit: 02/20/2022 ?  ?Last Visit: 09/19/2021 ?  ?Last Fill: 09/06/2021 ? ?UJ:WJXBJYNWGNX:Rheumatoid arthritis involving multiple sites with positive rheumatoid factor  ?  ?Current Dose per office note 09/19/2021:  ? ?Labs: 12/27/2021 CBC and CMP are normal. ? ?Okay to refill MTX?  ?

## 2022-01-15 ENCOUNTER — Other Ambulatory Visit: Payer: Self-pay | Admitting: Rheumatology

## 2022-01-15 NOTE — Telephone Encounter (Signed)
Next Visit: 02/20/2022 ?  ?Last Visit: 09/19/2021 ?  ?Last Fill: 01/09/2021 ? ?ER:XVQMGQQPYP arthritis involving multiple sites with positive rheumatoid factor  ?  ?Current Dose per office note 09/19/2021: folic acid 2 mg  ? ?Okay to refill Folic Acid?  ?

## 2022-01-22 ENCOUNTER — Other Ambulatory Visit: Payer: Self-pay | Admitting: *Deleted

## 2022-01-22 DIAGNOSIS — M0579 Rheumatoid arthritis with rheumatoid factor of multiple sites without organ or systems involvement: Secondary | ICD-10-CM

## 2022-01-22 MED ORDER — HUMIRA (2 PEN) 40 MG/0.4ML ~~LOC~~ AJKT: AUTO-INJECTOR | SUBCUTANEOUS | 0 refills | Status: DC

## 2022-01-22 NOTE — Telephone Encounter (Signed)
?  Next Visit: 02/20/2022 ?  ?Last Visit: 09/19/2021 ?  ?Last Fill: 12/25/2021 ?  ?LP:NPYYFRTMYT arthritis involving multiple sites with positive rheumatoid factor  ?  ?Current Dose per office note 09/19/2021: [name] 40 mg sq every 14 days ?  ?Labs: 12/27/2021,  CBC and CMP WNL ? ?TB Gold: 06/06/2021, negative ?  ?Okay to refill [name]?   ?  ? ?  ? ?

## 2022-02-06 NOTE — Progress Notes (Deleted)
? ?Office Visit Note ? ?Patient: Ricky PickettDavid L Hardin             ?Date of Birth: 20-Sep-1966           ?MRN: [id number]             ?PCP: Pollyann Savoyeveshwar, Shaili, MD ?Referring: Pollyann Savoyeveshwar, Shaili, MD ?Visit Date: 02/20/2022 ?Occupation: @GUAROCC @ ? ?Subjective:  ? ? ?History of Present Illness: Ricky PickettDavid L Hardin is a 56 y.o. male with history of seropositive rheumatoid arthritis and osteoarthritis.  He remains on humira 40 mg sq injections every 14 days, methotrexate 0.7 ml sq injections once weekly, and folic acid 2 mg daily.  ? ?CBC and CMP WNL on 12/27/21.   ?TB gold negative on 06/06/21.  Future order for TB gold placed today.  ?Discussed the importance of holding humira and methotrexate if he develops signs or symptoms of an infection and resume once the infection has completely cleared.  ?Discussed the importance of skin examinations while on humira due to the increased risk for skin cancer.   ? ?Activities of Daily Living:  ?Patient reports morning stiffness for *** {minute/hour:19697}.   ?Patient {ACTIONS;DENIES/REPORTS:21021675::"Denies"} nocturnal pain.  ?Difficulty dressing/grooming: {ACTIONS;DENIES/REPORTS:21021675::"Denies"} ?Difficulty climbing stairs: {ACTIONS;DENIES/REPORTS:21021675::"Denies"} ?Difficulty getting out of chair: {ACTIONS;DENIES/REPORTS:21021675::"Denies"} ?Difficulty using hands for taps, buttons, cutlery, and/or writing: {ACTIONS;DENIES/REPORTS:21021675::"Denies"} ? ?No Rheumatology ROS completed.  ? ?PMFS History:  ?Patient Active Problem List  ? Diagnosis Date Noted  ? RA (rheumatoid arthritis) (HCC) 09/19/2016  ? Osteoarthritis of both hands 09/19/2016  ? Osteoarthritis of both feet 09/19/2016  ?  ?Past Medical History:  ?Diagnosis Date  ? Osteoarthritis of both feet 09/19/2016  ? Osteoarthritis of both hands 09/19/2016  ? RA (rheumatoid arthritis) (HCC) 09/19/2016  ? +RF, +CCP Erosive disease  ?  ?Family History  ?Problem Relation Age of Onset  ? Colon polyps Mother   ? Heart disease Father 7560  ?  Rheum arthritis Sister   ? Colon cancer Neg Hx   ? Esophageal cancer Neg Hx   ? Stomach cancer Neg Hx   ? Rectal cancer Neg Hx   ? ?Past Surgical History:  ?Procedure Laterality Date  ? COLONOSCOPY  06/2020  ? ?Social History  ? ?Social History Narrative  ? Not on file  ? ? ?There is no immunization history on file for this patient.  ? ?Objective: ?Vital Signs: There were no vitals taken for this visit.  ? ?Physical Exam ?Vitals and nursing note reviewed.  ?Constitutional:   ?   Appearance: He is well-developed.  ?HENT:  ?   Head: Normocephalic and atraumatic.  ?Eyes:  ?   Conjunctiva/sclera: Conjunctivae normal.  ?   Pupils: Pupils are equal, round, and reactive to light.  ?Cardiovascular:  ?   Rate and Rhythm: Normal rate and regular rhythm.  ?   Heart sounds: Normal heart sounds.  ?Pulmonary:  ?   Effort: Pulmonary effort is normal.  ?   Breath sounds: Normal breath sounds.  ?Abdominal:  ?   General: Bowel sounds are normal.  ?   Palpations: Abdomen is soft.  ?Musculoskeletal:  ?   Cervical back: Normal range of motion and neck supple.  ?Skin: ?   General: Skin is warm and dry.  ?   Capillary Refill: Capillary refill takes less than 2 seconds.  ?Neurological:  ?   Mental Status: He is alert and oriented to person, place, and time.  ?Psychiatric:     ?   Behavior: Behavior normal.  ?  ? ?Musculoskeletal  Exam: *** ? ?CDAI Exam: ?CDAI Score: -- ?Patient Global: --; Provider Global: -- ?Swollen: --; Tender: -- ?Joint Exam 02/20/2022  ? ?No joint exam has been documented for this visit  ? ?There is currently no information documented on the homunculus. Go to the Rheumatology activity and complete the homunculus joint exam. ? ?Investigation: ?No additional findings. ? ?Imaging: ?No results found. ? ?Recent Labs: ?Lab Results  ?Component Value Date  ? WBC 6.5 12/27/2021  ? HGB 14.9 12/27/2021  ? PLT 252 12/27/2021  ? NA 139 12/27/2021  ? K 4.2 12/27/2021  ? CL 106 12/27/2021  ? CO2 26 12/27/2021  ? GLUCOSE 85 12/27/2021   ? BUN 20 12/27/2021  ? CREATININE 0.92 12/27/2021  ? BILITOT 0.9 12/27/2021  ? ALKPHOS 83 05/05/2017  ? AST 19 12/27/2021  ? ALT 19 12/27/2021  ? PROT 6.9 12/27/2021  ? ALBUMIN 4.3 05/05/2017  ? CALCIUM 9.4 12/27/2021  ? GFRAA 95 02/12/2021  ? QFTBGOLDPLUS NEGATIVE 06/06/2021  ? ? ?Speciality Comments: No specialty comments available. ? ?Procedures:  ?No procedures performed ?Allergies: Patient has no known allergies.  ? ?Assessment / Plan:     ?Visit Diagnoses: Rheumatoid arthritis involving multiple sites with positive rheumatoid factor (HCC) ? ?High risk medication use ? ?Primary osteoarthritis of both hands ? ?Primary osteoarthritis of both feet ? ?Family history of heart disease ? ?Orders: ?No orders of the defined types were placed in this encounter. ? ?No orders of the defined types were placed in this encounter. ? ? ?Face-to-face time spent with patient was *** minutes. Greater than 50% of time was spent in counseling and coordination of care. ? ?Follow-Up Instructions: No follow-ups on file. ? ? ?Gearldine Bienenstock, PA-C ? ?Note - This record has been created using [employer].  ?Chart creation errors have been sought, but may not always  ?have been located. Such creation errors do not reflect on  ?the standard of medical care. ? ?

## 2022-02-20 ENCOUNTER — Ambulatory Visit: Payer: Commercial Managed Care - PPO | Admitting: Physician Assistant

## 2022-02-20 DIAGNOSIS — M0579 Rheumatoid arthritis with rheumatoid factor of multiple sites without organ or systems involvement: Secondary | ICD-10-CM

## 2022-02-20 DIAGNOSIS — Z79899 Other long term (current) drug therapy: Secondary | ICD-10-CM

## 2022-02-20 DIAGNOSIS — Z8249 Family history of ischemic heart disease and other diseases of the circulatory system: Secondary | ICD-10-CM

## 2022-02-20 DIAGNOSIS — M19041 Primary osteoarthritis, right hand: Secondary | ICD-10-CM

## 2022-02-20 DIAGNOSIS — M19071 Primary osteoarthritis, right ankle and foot: Secondary | ICD-10-CM

## 2022-02-26 NOTE — Progress Notes (Signed)
? ?Office Visit Note ? ?Patient: Ricky Hardin             ?Date of Birth: 1966/05/24           ?MRN: [id number]             ?PCP: [facility] ?Referring: Pollyann Savoy, MD ?Visit Date: 02/27/2022 ?Occupation: @ ? ?Subjective:  ?Medication monitoring ? ?History of Present Illness: ISTVAN BEHAR is a 56 y.o. male with history of seropositive rheumatoid arthritis and osteoarthritis.  Patient is on [name] 40 mg sq injections every 14 days and methotrexate 0.7 mL sq injections once weekly.  Patient reports that he has had several short gaps in therapy over the past several months due to issues with refills requiring updated lab work.  He has been back on [name] and methotrexate consistently and his symptoms have improved.  He continues to have some stiffness and discomfort in the left wrist joint but denies any other joint pain or joint swelling at this time. ?He denies any new medical conditions.  ?He remains active playing tennis twice a week.  ? ? ?Activities of Daily Living:  ?Patient reports morning stiffness for a few minutes.   ?Patient Denies nocturnal pain.  ?Difficulty dressing/grooming: Denies ?Difficulty climbing stairs: Denies ?Difficulty getting out of chair: Denies ?Difficulty using hands for taps, buttons, cutlery, and/or writing: Reports ? ?Review of Systems  ?Constitutional:  Negative for fatigue.  ?HENT:  Negative for mouth sores, mouth dryness and nose dryness.   ?Eyes:  Negative for pain, itching and dryness.  ?Respiratory:  Negative for shortness of breath and difficulty breathing.   ?Cardiovascular:  Negative for chest pain and palpitations.  ?Gastrointestinal:  Negative for blood in stool, constipation and diarrhea.  ?Endocrine: Negative for increased urination.  ?Genitourinary:  Negative for difficulty urinating.  ?Musculoskeletal:  Positive for joint pain, joint pain and morning stiffness. Negative for joint swelling, myalgias, muscle tenderness and myalgias.   ?Skin:  Negative for color change, rash and redness.  ?Allergic/Immunologic: Negative for susceptible to infections.  ?Neurological:  Negative for dizziness, numbness, headaches, memory loss and weakness.  ?Hematological:  Negative for bruising/bleeding tendency.  ?Psychiatric/Behavioral:  Negative for confusion.   ? ?PMFS History:  ?Patient Active Problem List  ? Diagnosis Date Noted  ? RA (rheumatoid arthritis) (HCC) 09/19/2016  ? Osteoarthritis of both hands 09/19/2016  ? Osteoarthritis of both feet 09/19/2016  ?  ?Past Medical History:  ?Diagnosis Date  ? Osteoarthritis of both feet 09/19/2016  ? Osteoarthritis of both hands 09/19/2016  ? RA (rheumatoid arthritis) (HCC) 09/19/2016  ? +RF, +CCP Erosive disease  ?  ?Family History  ?Problem Relation Age of Onset  ? Colon polyps Mother   ? Heart disease Father 10  ? Rheum arthritis Sister   ? Colon cancer Neg Hx   ? Esophageal cancer Neg Hx   ? Stomach cancer Neg Hx   ? Rectal cancer Neg Hx   ? ?Past Surgical History:  ?Procedure Laterality Date  ? COLONOSCOPY  06/2020  ? ?Social History  ? ?Social History Narrative  ? Not on file  ? ? ?There is no immunization history on file for this patient.  ? ?Objective: ?Vital Signs: BP (!) 146/90 (BP Location: Right Arm, Patient Position: Sitting, Cuff Size: Normal)   Pulse 71   Ht  (1.778 m)   Wt 180 lb 12.8 oz (82 kg)   BMI 25.94 kg/m?   ? ?Physical Exam ?Vitals and nursing  note reviewed.  ?Constitutional:   ?   Appearance: He is well-developed.  ?HENT:  ?   Head: Normocephalic and atraumatic.  ?Eyes:  ?   Conjunctiva/sclera: Conjunctivae normal.  ?   Pupils: Pupils are equal, round, and reactive to light.  ?Cardiovascular:  ?   Rate and Rhythm: Normal rate and regular rhythm.  ?   Heart sounds: Normal heart sounds.  ?Pulmonary:  ?   Effort: Pulmonary effort is normal.  ?   Breath sounds: Normal breath sounds.  ?Abdominal:  ?   General: Bowel sounds are normal.  ?   Palpations: Abdomen is soft.   ?Musculoskeletal:  ?   Cervical back: Normal range of motion and neck supple.  ?Skin: ?   General: Skin is warm and dry.  ?   Capillary Refill: Capillary refill takes less than 2 seconds.  ?Neurological:  ?   Mental Status: He is alert and oriented to person, place, and time.  ?Psychiatric:     ?   Behavior: Behavior normal.  ?  ? ?Musculoskeletal Exam: C-spine, thoracic spine, lumbar spine have good range of motion with no discomfort.  No midline spinal tenderness.  No SI joint tenderness.  Shoulder joints and elbow joints have good ROM.  Limited ROM of the left wrist joint especially with extension.  Tenderness over the left wrist but no synovitis was noted.  MCPs, PIPs, DIPs have good range of motion with no synovitis. PIP  and DIP thickening consistent with osteoarthritis of both hands. Complete fist formation bilaterally.  Hip joints have good range of motion with no discomfort.  Knee joints have good range of motion with no warmth or effusion.  Ankle joints have good range of motion with no tenderness or joint swelling. ? ?CDAI Exam: ?CDAI Score: 0.6  ?Patient Global: 3 mm; Provider Global: 3 mm ?Swollen: 0 ; Tender: 0  ?Joint Exam 02/27/2022  ? ?No joint exam has been documented for this visit  ? ?There is currently no information documented on the homunculus. Go to the Rheumatology activity and complete the homunculus joint exam. ? ?Investigation: ?No additional findings. ? ?Imaging: ?No results found. ? ?Recent Labs: ?Lab Results  ?Component Value Date  ? WBC 6.5 12/27/2021  ? HGB 14.9 12/27/2021  ? PLT 252 12/27/2021  ? NA 139 12/27/2021  ? K 4.2 12/27/2021  ? CL 106 12/27/2021  ? CO2 26 12/27/2021  ? GLUCOSE 85 12/27/2021  ? BUN 20 12/27/2021  ? CREATININE 0.92 12/27/2021  ? BILITOT 0.9 12/27/2021  ? ALKPHOS 83 05/05/2017  ? AST 19 12/27/2021  ? ALT 19 12/27/2021  ? PROT 6.9 12/27/2021  ? ALBUMIN 4.3 05/05/2017  ? CALCIUM 9.4 12/27/2021  ? GFRAA 95 02/12/2021  ? QFTBGOLDPLUS NEGATIVE 06/06/2021   ? ? ?Speciality Comments: No specialty comments available. ? ?Procedures:  ?No procedures performed ?Allergies: Patient has no known allergies.  ? ?Assessment / Plan:     ?Visit Diagnoses: Rheumatoid arthritis involving multiple sites with positive rheumatoid factor (HCC) - +RF, +anti-CCP, erosive disease: No synovitis was noted on examination.  He has limited range of motion of the left wrist joint especially with extension on examination today.  Tenderness of the left wrist was noted but no synovitis was noted.  He has had several short gaps in therapy over the past couple of months due to issues with refills as well as lab work.  He has been back on [name] and methotrexate consistently and his symptoms have started to improve.  He remains active playing tennis twice a week without difficulty.  Discussed the importance of joint protection and muscle strengthening.  He will remain on [name] and methotrexate as prescribed.  He does not need any refills at this time.  He plans on returning for lab work in May and every 3 months to monitor for drug toxicity.  He will follow-up in the office in 4 to 5 months or sooner if needed. ? ?Association of heart disease with rheumatoid arthritis was discussed. Need to monitor blood pressure, cholesterol, and to exercise 30-60 minutes on daily basis was discussed. ? ?High risk medication use - [name] 40 mg sq every 14 days, MTX 0.7 ml once weekly, and folic acid 2 mg - Plan: QuantiFERON-TB Gold Plus ?CBC and CMP drawn on 12/27/2021.  His next lab work is due in May and every 3 months.  Standing orders for CBC and CMP are in place. ?  TB Gold negative on 06/06/2021.  Future order will be placed today.  ?Discussed the importance of holding [name] and methotrexate if he develops signs or symptoms of an infection and to resume once infection has completely cleared. ? ?Primary osteoarthritis of both hands: He has PIP and DIP thickening consistent with osteoarthritis of both hands.  No  tenderness or inflammation was noted on examination today.  He is able to make a complete fist bilaterally.  Discussed the importance of joint protection and muscle strengthening.  He was encouraged to perform hand exercises daily. ?

## 2022-02-27 ENCOUNTER — Ambulatory Visit (INDEPENDENT_AMBULATORY_CARE_PROVIDER_SITE_OTHER): Payer: Commercial Managed Care - PPO | Admitting: Physician Assistant

## 2022-02-27 ENCOUNTER — Encounter: Payer: Self-pay | Admitting: Physician Assistant

## 2022-02-27 VITALS — BP 146/90 | HR 71 | Ht 70.0 in | Wt 180.8 lb

## 2022-02-27 DIAGNOSIS — M19071 Primary osteoarthritis, right ankle and foot: Secondary | ICD-10-CM | POA: Diagnosis not present

## 2022-02-27 DIAGNOSIS — M19041 Primary osteoarthritis, right hand: Secondary | ICD-10-CM | POA: Diagnosis not present

## 2022-02-27 DIAGNOSIS — M19042 Primary osteoarthritis, left hand: Secondary | ICD-10-CM

## 2022-02-27 DIAGNOSIS — Z139 Encounter for screening, unspecified: Secondary | ICD-10-CM

## 2022-02-27 DIAGNOSIS — M19072 Primary osteoarthritis, left ankle and foot: Secondary | ICD-10-CM

## 2022-02-27 DIAGNOSIS — Z79899 Other long term (current) drug therapy: Secondary | ICD-10-CM | POA: Diagnosis not present

## 2022-02-27 DIAGNOSIS — Z8249 Family history of ischemic heart disease and other diseases of the circulatory system: Secondary | ICD-10-CM

## 2022-02-27 DIAGNOSIS — M0579 Rheumatoid arthritis with rheumatoid factor of multiple sites without organ or systems involvement: Secondary | ICD-10-CM

## 2022-02-27 NOTE — Patient Instructions (Signed)
Standing Labs °We placed an order today for your standing lab work.  ° °Please have your standing labs drawn in May and every 3 months  ° ° °If possible, please have your labs drawn 2 weeks prior to your appointment so that the provider can discuss your results at your appointment. ° °Please note that you may see your imaging and lab results in MyChart before we have reviewed them. °We may be awaiting multiple results to interpret others before contacting you. °Please allow our office up to 72 hours to thoroughly review all of the results before contacting the office for clarification of your results. ° °We have open lab daily: °Monday through Thursday from 1:30-4:30 PM and Friday from 1:30-4:00 PM °at the office of Dr. Shaili Deveshwar, [facility].   °Please be advised, all patients with office appointments requiring lab work will take precedent over walk-in lab work.  °If possible, please come for your lab work on Monday and Friday afternoons, as you may experience shorter wait times. °The office is located at [address], [address], , [place] [address] °No appointment is necessary.   °Labs are drawn by [facility]. Please bring your co-pay at the time of your lab draw.  You may receive a bill from [facility] for your lab work. ° °Please note if you are on Hydroxychloroquine and and an order has been placed for a Hydroxychloroquine level, you will need to have it drawn 4 hours or more after your last dose. ° °If you wish to have your labs drawn at another location, please call the office 24 hours in advance to send orders. ° °If you have any questions regarding directions or hours of operation,  °please call [phone].   °As a reminder, please drink plenty of water prior to coming for your lab work. Thanks! ° °

## 2022-04-14 ENCOUNTER — Other Ambulatory Visit: Payer: Self-pay | Admitting: *Deleted

## 2022-04-14 DIAGNOSIS — M0579 Rheumatoid arthritis with rheumatoid factor of multiple sites without organ or systems involvement: Secondary | ICD-10-CM

## 2022-04-14 MED ORDER — HUMIRA (2 PEN) 40 MG/0.4ML ~~LOC~~ AJKT: AUTO-INJECTOR | SUBCUTANEOUS | 0 refills | Status: DC

## 2022-04-14 NOTE — Telephone Encounter (Signed)
Next Visit: 07/01/2022  Last Visit: 02/27/2022  Last Fill: 01/22/2022  DX: Rheumatoid arthritis involving multiple sites with positive rheumatoid factor   Current Dose per office note 02/27/2022: [name] 40 mg sq every 14 days  Labs: 12/27/2021, CBC and CMP WNL, LMOM labs due  TB Gold: 06/06/2022,  TB gold negative.   Okay to refill [name]?

## 2022-04-17 ENCOUNTER — Other Ambulatory Visit: Payer: Self-pay | Admitting: *Deleted

## 2022-04-17 ENCOUNTER — Other Ambulatory Visit: Payer: Self-pay | Admitting: Rheumatology

## 2022-04-17 DIAGNOSIS — Z79899 Other long term (current) drug therapy: Secondary | ICD-10-CM

## 2022-04-17 MED ORDER — METHOTREXATE SODIUM CHEMO INJECTION (PF) 50 MG/2ML: INTRAMUSCULAR | 2 refills | Status: DC

## 2022-04-17 NOTE — Telephone Encounter (Signed)
Next Visit: 07/01/2022  Last Visit: 02/27/2022  Last Fill: 01/13/2022  DX: Rheumatoid arthritis involving multiple sites with positive rheumatoid factor   Current Dose per office note 02/27/2022: MTX 0.7 ml once weekly  Labs: 12/27/2021, CBC and CMP WNL,  Patient updating labs today.   Okay to refill MTX?

## 2022-04-17 NOTE — Telephone Encounter (Signed)
Patient came by the office to update labs. Patient states he needs the labs in order to fill his Methotrexate. Patient states he is out of medication and requested a sample. Patient states he was due yesterday for the medication.

## 2022-04-22 ENCOUNTER — Telehealth: Payer: Self-pay | Admitting: Rheumatology

## 2022-04-22 LAB — COMPLETE METABOLIC PANEL WITH GFR
AG Ratio: 1.4 (calc) (ref 1.0–2.5)
ALT: 25 U/L (ref 9–46)
AST: 26 U/L (ref 10–35)
Albumin: 4.2 g/dL (ref 3.6–5.1)
Alkaline phosphatase (APISO): 70 U/L (ref 35–144)
BUN: 22 mg/dL (ref 7–25)
CO2: 25 mmol/L (ref 20–32)
Calcium: 9.6 mg/dL (ref 8.6–10.3)
Chloride: 106 mmol/L (ref 98–110)
Creat: 1.02 mg/dL (ref 0.70–1.30)
Globulin: 3.1 g/dL (calc) (ref 1.9–3.7)
Glucose, Bld: 97 mg/dL (ref 65–99)
Potassium: 4.3 mmol/L (ref 3.5–5.3)
Sodium: 142 mmol/L (ref 135–146)
Total Bilirubin: 0.9 mg/dL (ref 0.2–1.2)
Total Protein: 7.3 g/dL (ref 6.1–8.1)
eGFR: 86 mL/min/{1.73_m2} (ref >=60)

## 2022-04-22 LAB — QUANTIFERON-TB GOLD PLUS
Mitogen-NIL: 8.89 IU/mL
NIL: 0.02 IU/mL
QuantiFERON-TB Gold Plus: NEGATIVE
TB1-NIL: 0 IU/mL
TB2-NIL: 0.01 IU/mL

## 2022-04-22 LAB — CBC WITH DIFFERENTIAL/PLATELET
Absolute Monocytes: 712 cells/uL (ref 200–950)
Basophils Absolute: 64 cells/uL (ref 0–200)
Basophils Relative: 0.8 %
Eosinophils Absolute: 200 cells/uL (ref 15–500)
Eosinophils Relative: 2.5 %
HCT: 44.4 % (ref 38.5–50.0)
Hemoglobin: 15.2 g/dL (ref 13.2–17.1)
Lymphs Abs: 2616 cells/uL (ref 850–3900)
MCH: 31 pg (ref 27.0–33.0)
MCHC: 34.2 g/dL (ref 32.0–36.0)
MCV: 90.6 fL (ref 80.0–100.0)
MPV: 12 fL (ref 7.5–12.5)
Monocytes Relative: 8.9 %
Neutro Abs: 4408 cells/uL (ref 1500–7800)
Neutrophils Relative %: 55.1 %
Platelets: 219 10*3/uL (ref 140–400)
RBC: 4.9 10*6/uL (ref 4.20–5.80)
RDW: 13.2 % (ref 11.0–15.0)
Total Lymphocyte: 32.7 %
WBC: 8 10*3/uL (ref 3.8–10.8)

## 2022-04-22 NOTE — Telephone Encounter (Signed)
Patient returned call to the office. Patient states he has only tried [facility] and [facility] to see if the MTX vial was available. Patient advised her could call some of the other pharmacies in town to see who may have it available and let us know then we can send prescription there or we could see about switching him to tablets. Patient advised to call the office to advised what he would like to do.

## 2022-04-22 NOTE — Telephone Encounter (Signed)
Attempted to contact the patient and left message for patient to call the office.  

## 2022-04-22 NOTE — Telephone Encounter (Signed)
Patient called stating he received a call from [facility] that they don't have Methotrexate in stock and not sure when they will be receiving the medication due to a national shortage.  Patient states he also called [facility] pharmacy and was told the same thing.  Patient requested a return call.

## 2022-04-23 NOTE — Progress Notes (Signed)
CBC and CMP WNL.  TB gold negative.

## 2022-04-25 ENCOUNTER — Other Ambulatory Visit: Payer: Self-pay | Admitting: Rheumatology

## 2022-04-25 MED ORDER — METHOTREXATE 2.5 MG PO TABS: 17.5000 mg | ORAL_TABLET | ORAL | 0 refills | Status: DC

## 2022-04-25 NOTE — Telephone Encounter (Signed)
Current Dose per office note on 02/27/2022 : MTX 0.7 ml once weekly  Okay to change prescription to MTX 7 tabs po once weekly?

## 2022-04-25 NOTE — Telephone Encounter (Signed)
Ok to switch the patient to oral methotrexate 7 tablets by mouth once weekly.

## 2022-04-25 NOTE — Telephone Encounter (Signed)
Patient called stating he has not been able to locate methotrexate vials and is requesting a prescription of Methotrexate in tablet form be sent to [facility] at [address] [facility] [address] [facility].  Patient states he has been out of medication for the past 2 days and would like to pick up the prescription this afternoon.

## 2022-06-17 NOTE — Progress Notes (Deleted)
Office Visit Note  Patient: Ricky Hardin             Date of Birth: May 16, 1966           MRN: [id number]             PCP: [facility] Referring: [facility] Visit Date: 07/01/2022 Occupation: @GUAROCC @  Subjective:  No chief complaint on file.   History of Present Illness: Ricky Hardin is a 56 y.o. male ***   Activities of Daily Living:  Patient reports morning stiffness for *** {minute/hour:19697}.   Patient {ACTIONS;DENIES/REPORTS:21021675::"Denies"} nocturnal pain.  Difficulty dressing/grooming: {ACTIONS;DENIES/REPORTS:21021675::"Denies"} Difficulty climbing stairs: {ACTIONS;DENIES/REPORTS:21021675::"Denies"} Difficulty getting out of chair: {ACTIONS;DENIES/REPORTS:21021675::"Denies"} Difficulty using hands for taps, buttons, cutlery, and/or writing: {ACTIONS;DENIES/REPORTS:21021675::"Denies"}  No Rheumatology ROS completed.   PMFS History:  Patient Active Problem List   Diagnosis Date Noted   RA (rheumatoid arthritis) (HCC) 09/19/2016   Osteoarthritis of both hands 09/19/2016   Osteoarthritis of both feet 09/19/2016    Past Medical History:  Diagnosis Date   Osteoarthritis of both feet 09/19/2016   Osteoarthritis of both hands 09/19/2016   RA (rheumatoid arthritis) (HCC) 09/19/2016   +RF, +CCP Erosive disease    Family History  Problem Relation Age of Onset   Colon polyps Mother    Heart disease Father 73   Rheum arthritis Sister    Colon cancer Neg Hx    Esophageal cancer Neg Hx    Stomach cancer Neg Hx    Rectal cancer Neg Hx    Past Surgical History:  Procedure Laterality Date   COLONOSCOPY  06/2020   Social History   Social History Narrative   Not on file    There is no immunization history on file for this patient.   Objective: Vital Signs: There were no vitals taken for this visit.   Physical Exam   Musculoskeletal Exam: ***  CDAI Exam: CDAI Score: -- Patient Global: --; Provider Global: -- Swollen:  --; Tender: -- Joint Exam 07/01/2022   No joint exam has been documented for this visit   There is currently no information documented on the homunculus. Go to the Rheumatology activity and complete the homunculus joint exam.  Investigation: No additional findings.  Imaging: No results found.  Recent Labs: Lab Results  Component Value Date   WBC 8.0 04/17/2022   HGB 15.2 04/17/2022   PLT 219 04/17/2022   NA 142 04/17/2022   K 4.3 04/17/2022   CL 106 04/17/2022   CO2 25 04/17/2022   GLUCOSE 97 04/17/2022   BUN 22 04/17/2022   CREATININE 1.02 04/17/2022   BILITOT 0.9 04/17/2022   ALKPHOS 83 05/05/2017   AST 26 04/17/2022   ALT 25 04/17/2022   PROT 7.3 04/17/2022   ALBUMIN 4.3 05/05/2017   CALCIUM 9.6 04/17/2022   GFRAA 95 02/12/2021   QFTBGOLDPLUS NEGATIVE 04/17/2022    Speciality Comments: No specialty comments available.  Procedures:  No procedures performed Allergies: Patient has no known allergies.   Assessment / Plan:     Visit Diagnoses: No diagnosis found.  Orders: No orders of the defined types were placed in this encounter.  No orders of the defined types were placed in this encounter.   Face-to-face time spent with patient was *** minutes. Greater than 50% of time was spent in counseling and coordination of care.  Follow-Up Instructions: No follow-ups on file.   06/17/2022, CMA  Note - This record has been created using  Editor, commissioning.  Chart creation errors have been sought, but may not always  have been located. Such creation errors do not reflect on  the standard of medical care.

## 2022-07-01 ENCOUNTER — Ambulatory Visit: Payer: Commercial Managed Care - PPO | Attending: Rheumatology | Admitting: Rheumatology

## 2022-07-01 DIAGNOSIS — M0579 Rheumatoid arthritis with rheumatoid factor of multiple sites without organ or systems involvement: Secondary | ICD-10-CM

## 2022-07-01 DIAGNOSIS — Z79899 Other long term (current) drug therapy: Secondary | ICD-10-CM

## 2022-07-01 DIAGNOSIS — M19041 Primary osteoarthritis, right hand: Secondary | ICD-10-CM

## 2022-07-01 DIAGNOSIS — M19071 Primary osteoarthritis, right ankle and foot: Secondary | ICD-10-CM

## 2022-07-01 DIAGNOSIS — Z8249 Family history of ischemic heart disease and other diseases of the circulatory system: Secondary | ICD-10-CM

## 2022-07-10 ENCOUNTER — Other Ambulatory Visit: Payer: Self-pay | Admitting: Physician Assistant

## 2022-07-10 ENCOUNTER — Other Ambulatory Visit: Payer: Self-pay | Admitting: *Deleted

## 2022-07-10 DIAGNOSIS — M0579 Rheumatoid arthritis with rheumatoid factor of multiple sites without organ or systems involvement: Secondary | ICD-10-CM

## 2022-07-10 MED ORDER — HUMIRA (2 PEN) 40 MG/0.4ML ~~LOC~~ AJKT: AUTO-INJECTOR | SUBCUTANEOUS | 0 refills | Status: DC

## 2022-07-10 NOTE — Progress Notes (Unsigned)
Office Visit Note  Patient: Ricky Hardin             Date of Birth: 17-Dec-1965           MRN: [id number]             PCP: [facility] Referring: [facility] Visit Date: 07/15/2022 Occupation: @GUAROCC @  Subjective:  Medication monitoring  History of Present Illness: Ricky Hardin is a 56 y.o. male with history of seropositive rheumatoid arthritis and osteoarthritis.  Patient remains on humira 40 mg sq injections every 14 days, methotrexate 7 tablets by mouth once weekly, and folic acid 2 mg daily.  He had to switch from injectable to oral methotrexate due to pharmacy shortage with injectable methotrexate.  He has not noticed any increased joint pain or swelling since making the switch.  He did have some increased soreness and stiffness in his left wrist with interruption in therapy while trying to obtain a new methotrexate prescription.  He denies any joint swelling in the left wrist at this time.  He denies any other joint pain or joint swelling currently.  He denies any new medical conditions.  He has not had any recent or recurrent infections.    Activities of Daily Living:  Patient reports morning stiffness for a few  minutes.   Patient Denies nocturnal pain.  Difficulty dressing/grooming: Denies Difficulty climbing stairs: Denies Difficulty getting out of chair: Denies Difficulty using hands for taps, buttons, cutlery, and/or writing: Denies  Review of Systems  Constitutional:  Negative for fatigue.  HENT:  Negative for mouth sores and mouth dryness.   Eyes:  Negative for dryness.  Respiratory:  Negative for shortness of breath.   Cardiovascular:  Negative for chest pain and palpitations.  Gastrointestinal:  Negative for blood in stool, constipation and diarrhea.  Endocrine: Negative for increased urination.  Genitourinary:  Negative for involuntary urination.  Musculoskeletal:  Positive for joint pain, joint pain and morning stiffness.  Negative for gait problem, joint swelling, myalgias, muscle weakness, muscle tenderness and myalgias.  Skin:  Negative for color change, rash, hair loss and sensitivity to sunlight.  Allergic/Immunologic: Negative for susceptible to infections.  Neurological:  Negative for dizziness and headaches.  Hematological:  Negative for swollen glands.  Psychiatric/Behavioral:  Negative for depressed mood and sleep disturbance. The patient is not nervous/anxious.     PMFS History:  Patient Active Problem List   Diagnosis Date Noted   RA (rheumatoid arthritis) (HCC) 09/19/2016   Osteoarthritis of both hands 09/19/2016   Osteoarthritis of both feet 09/19/2016    Past Medical History:  Diagnosis Date   Osteoarthritis of both feet 09/19/2016   Osteoarthritis of both hands 09/19/2016   RA (rheumatoid arthritis) (HCC) 09/19/2016   +RF, +CCP Erosive disease    Family History  Problem Relation Age of Onset   Colon polyps Mother    Heart disease Father 74   Rheum arthritis Sister    Colon cancer Neg Hx    Esophageal cancer Neg Hx    Stomach cancer Neg Hx    Rectal cancer Neg Hx    Past Surgical History:  Procedure Laterality Date   COLONOSCOPY  06/2020   Social History   Social History Narrative   Not on file    There is no immunization history on file for this patient.   Objective: Vital Signs: BP 103/67 (BP Location: Left Arm, Patient Position: Sitting, Cuff Size: Normal)   Pulse 81  Resp 15   Ht 5\' 10"  (1.778 m)   Wt 185 lb (83.9 kg)   BMI 26.54 kg/m    Physical Exam Vitals and nursing note reviewed.  Constitutional:      Appearance: He is well-developed.  HENT:     Head: Normocephalic and atraumatic.  Eyes:     Conjunctiva/sclera: Conjunctivae normal.     Pupils: Pupils are equal, round, and reactive to light.  Cardiovascular:     Rate and Rhythm: Normal rate and regular rhythm.     Heart sounds: Normal heart sounds.  Pulmonary:     Effort: Pulmonary effort is  normal.     Breath sounds: Normal breath sounds.  Abdominal:     General: Bowel sounds are normal.     Palpations: Abdomen is soft.  Musculoskeletal:     Cervical back: Normal range of motion and neck supple.  Skin:    General: Skin is warm and dry.     Capillary Refill: Capillary refill takes less than 2 seconds.  Neurological:     Mental Status: He is alert and oriented to person, place, and time.  Psychiatric:        Behavior: Behavior normal.      Musculoskeletal Exam: C-spine, thoracic spine, lumbar spine have good range of motion with no discomfort.  No midline spinal tenderness.  Shoulder joints and elbow joints have good range of motion with no discomfort.  Some tenderness over the left intercarpal joints.  PIP and DIP thickening consistent with osteoarthritis of both hands.  Complete fist formation noted bilaterally.  Hip joints have good range of motion with no groin pain.  Knee joints have good range of motion with no warmth or effusion.  Ankle joints have good range of motion with no tenderness or joint swelling.  CDAI Exam: CDAI Score: 0.4  Patient Global: 2 mm; Provider Global: 2 mm Swollen: 0 ; Tender: 0  Joint Exam 07/15/2022   No joint exam has been documented for this visit   There is currently no information documented on the homunculus. Go to the Rheumatology activity and complete the homunculus joint exam.  Investigation: No additional findings.  Imaging: No results found.  Recent Labs: Lab Results  Component Value Date   WBC 8.0 04/17/2022   HGB 15.2 04/17/2022   PLT 219 04/17/2022   NA 142 04/17/2022   K 4.3 04/17/2022   CL 106 04/17/2022   CO2 25 04/17/2022   GLUCOSE 97 04/17/2022   BUN 22 04/17/2022   CREATININE 1.02 04/17/2022   BILITOT 0.9 04/17/2022   ALKPHOS 83 05/05/2017   AST 26 04/17/2022   ALT 25 04/17/2022   PROT 7.3 04/17/2022   ALBUMIN 4.3 05/05/2017   CALCIUM 9.6 04/17/2022   GFRAA 95 02/12/2021   QFTBGOLDPLUS NEGATIVE  04/17/2022    Speciality Comments: No specialty comments available.  Procedures:  No procedures performed Allergies: Patient has no known allergies.   Assessment / Plan:     Visit Diagnoses: Rheumatoid arthritis involving multiple sites with positive rheumatoid factor (HCC): He has no synovitis on examination today.  He has some tenderness over the left intercarpal joints but no erythema or inflammation was noted.  He remains on [name] 40 mg subcutaneous injections every 14 days and is currently taking methotrexate 7 tablets by mouth once weekly.  He switched from injectable to oral methotrexate due to the pharmacy shortage.  He had a small gap in therapy will waiting on the change in prescription at which  time he noticed increased pain and stiffness in the left wrist joint but no joint swelling.  His symptoms have been alleviated since resuming methotrexate dosing on a weekly basis.  Discussed that if his symptoms persist or worsen we can always schedule an ultrasound-guided left wrist injection.  He is not experiencing any other joint pain or joint swelling at this time.  He will remain on the current treatment regimen.  He was advised to notify us if he develops increased joint pain or joint swelling.  Follow-up in the office in 5 months or sooner if needed. X-rays of both hands and feet will be updated at has upcoming follow-up visit to assess for radiographic progression.  High risk medication use - [name] 40 mg sq injections every 14 days, methotrexate 7 tablets by mouth once weekly, and folic acid 2 mg daily.  - Plan: CBC with Differential/Platelet, COMPLETE METABOLIC PANEL WITH GFR CBC and CMP WNL on 04/17/22. Orders for CBC and CMP released.  His next lab work will be due in December and every 3 months.  TB gold negative on 04/17/22 and will continue to be monitored yearly. He has not had any recent or recurrent infections.  Discussed the importance of holding humira and methotrexate if he  develops signs or symptoms of an infection and to resume once the infection has completely cleared.  Primary osteoarthritis of both hands: He has PIP and DIP thickening consistent with osteoarthritis of both hands.  Complete fist formation noted bilaterally.  Some tenderness over the left intercarpal joints noted.  No synovitis was apparent.  Discussed the importance of joint protection and muscle strengthening.  He was advised to notify us if his left wrist pain and stiffness do not improve. X-rays of both hands will be updated at his upcoming follow-up visit to assess for radiographic progression.  Primary osteoarthritis of both feet: He is not experiencing any increased discomfort in his feet at this time.  He has good range of motion of both ankle joints with no tenderness or synovitis.  X-rays of both feet will be updated at his upcoming follow-up visit to assess for radiographic progression.  Family history of heart disease  Orders: Orders Placed This Encounter  Procedures   CBC with Differential/Platelet   COMPLETE METABOLIC PANEL WITH GFR   No orders of the defined types were placed in this encounter.   Follow-Up Instructions: Return in about 5 months (around 12/15/2022) for Rheumatoid arthritis, Osteoarthritis.   Gearldine Bienenstock, PA-C  Note - This record has been created using Dragon software.  Chart creation errors have been sought, but may not always  have been located. Such creation errors do not reflect on  the standard of medical care.

## 2022-07-10 NOTE — Telephone Encounter (Signed)
Next Visit: 07/15/2022   Last Visit: 02/27/2022   Last Fill: 04/25/2022   DX: Rheumatoid arthritis involving multiple sites with positive rheumatoid factor    Current Dose per office note 02/27/2022: MTX 0.7 ml once weekly (Patient switched to tablets due to shortage of vial)   Labs: 04/17/2022 CBC and CMP WNL   Left message to advise patient he is due to update labs and a follow up visit.   Okay to refill MTX?

## 2022-07-10 NOTE — Telephone Encounter (Signed)
Refill request received via fax from [facility] for [name].   Next Visit: Due August 2023.    Last Visit: 02/27/2022   Last Fill: 04/14/2022   DX: Rheumatoid arthritis involving multiple sites with positive rheumatoid factor    Current Dose per office note 02/27/2022: [name] 40 mg sq every 14 days   Labs: 04/17/2022 CBC and CMP WNL   TB Gold: 04/17/2022,  TB gold negative.   Left message to advise patient he is due to update labs and a follow up visit.    Okay to refill [name]?

## 2022-07-15 ENCOUNTER — Encounter: Payer: Self-pay | Admitting: Physician Assistant

## 2022-07-15 ENCOUNTER — Ambulatory Visit: Payer: Commercial Managed Care - PPO | Attending: Physician Assistant | Admitting: Physician Assistant

## 2022-07-15 ENCOUNTER — Other Ambulatory Visit: Payer: Self-pay

## 2022-07-15 VITALS — BP 103/67 | HR 81 | Resp 15 | Ht 70.0 in | Wt 185.0 lb

## 2022-07-15 DIAGNOSIS — M19071 Primary osteoarthritis, right ankle and foot: Secondary | ICD-10-CM

## 2022-07-15 DIAGNOSIS — M19041 Primary osteoarthritis, right hand: Secondary | ICD-10-CM | POA: Diagnosis not present

## 2022-07-15 DIAGNOSIS — M0579 Rheumatoid arthritis with rheumatoid factor of multiple sites without organ or systems involvement: Secondary | ICD-10-CM

## 2022-07-15 DIAGNOSIS — Z79899 Other long term (current) drug therapy: Secondary | ICD-10-CM | POA: Diagnosis not present

## 2022-07-15 DIAGNOSIS — Z8249 Family history of ischemic heart disease and other diseases of the circulatory system: Secondary | ICD-10-CM

## 2022-07-15 DIAGNOSIS — M19042 Primary osteoarthritis, left hand: Secondary | ICD-10-CM

## 2022-07-15 DIAGNOSIS — M19072 Primary osteoarthritis, left ankle and foot: Secondary | ICD-10-CM

## 2022-07-15 MED ORDER — HUMIRA (2 PEN) 40 MG/0.4ML ~~LOC~~ AJKT: AUTO-INJECTOR | SUBCUTANEOUS | 0 refills | Status: DC

## 2022-07-15 NOTE — Patient Instructions (Signed)
Standing Labs We placed an order today for your standing lab work.   Please have your standing labs drawn in December and every 3 months   If possible, please have your labs drawn 2 weeks prior to your appointment so that the provider can discuss your results at your appointment.  Please note that you may see your imaging and lab results in MyChart before we have reviewed them. We may be awaiting multiple results to interpret others before contacting you. Please allow our office up to 72 hours to thoroughly review all of the results before contacting the office for clarification of your results.  We currently have open lab daily: Monday through Thursday from 1:30 PM-4:30 PM and Friday from 1:30 PM- 4:00 PM If possible, please come for your lab work on Monday, Thursday or Friday afternoons, as you may experience shorter wait times.   Effective September 10, 2022 the new lab hours will change to: Monday through Thursday from 1:30 PM-5:00 PM and Friday from 8:30 AM-12:00 PM If possible, please come for your lab work on Monday and Thursday afternoons, as you may experience shorter wait times.  Please be advised, all patients with office appointments requiring lab work will take precedent over walk-in lab work.    The office is located at [address], [address], [place], [place] [address] No appointment is necessary.   Labs are drawn by [facility]. Please bring your co-pay at the time of your lab draw.  You may receive a bill from [facility] for your lab work.  Please note if you are on Hydroxychloroquine and and an order has been placed for a Hydroxychloroquine level, you will need to have it drawn 4 hours or more after your last dose.  If you wish to have your labs drawn at another location, please call the office 24 hours in advance to send orders.  If you have any questions regarding directions or hours of operation,  please call [phone].   As a reminder, please drink plenty of water prior  to coming for your lab work. Thanks!   If you have signs or symptoms of an infection or start antibiotics: First, call your PCP for workup of your infection. Hold your medication through the infection, until you complete your antibiotics, and until symptoms resolve if you take the following: Injectable medication (Actemra, Benlysta, Cimzia, Cosentyx, Enbrel, [name], Kevzara, Orencia, Remicade, Simponi, Stelara, Taltz, Tremfya) Methotrexate Leflunomide (Arava) Mycophenolate (Cellcept) Xeljanz, Olumiant, or Rinvoq Vaccines You are taking a medication(s) that can suppress your immune system.  The following immunizations are recommended: Flu annually Covid-19  Td/Tdap (tetanus, diphtheria, pertussis) every 10 years Pneumonia (Prevnar 15 then Pneumovax 23 at least 1 year apart.  Alternatively, can take Prevnar 20 without needing additional dose) Shingrix: 2 doses from 4 weeks to 6 months apart  Please check with your PCP to make sure you are up to date.   

## 2022-07-15 NOTE — Telephone Encounter (Signed)
Please review and sign humira refill that was requested today at patient's appointment. Thanks!

## 2022-07-16 LAB — CBC WITH DIFFERENTIAL/PLATELET
Absolute Monocytes: 655 cells/uL (ref 200–950)
Basophils Absolute: 62 cells/uL (ref 0–200)
Basophils Relative: 0.8 %
Eosinophils Absolute: 179 cells/uL (ref 15–500)
Eosinophils Relative: 2.3 %
HCT: 45 % (ref 38.5–50.0)
Hemoglobin: 15.2 g/dL (ref 13.2–17.1)
Lymphs Abs: 2527 cells/uL (ref 850–3900)
MCH: 30.8 pg (ref 27.0–33.0)
MCHC: 33.8 g/dL (ref 32.0–36.0)
MCV: 91.3 fL (ref 80.0–100.0)
MPV: 11.9 fL (ref 7.5–12.5)
Monocytes Relative: 8.4 %
Neutro Abs: 4376 cells/uL (ref 1500–7800)
Neutrophils Relative %: 56.1 %
Platelets: 221 10*3/uL (ref 140–400)
RBC: 4.93 10*6/uL (ref 4.20–5.80)
RDW: 12.8 % (ref 11.0–15.0)
Total Lymphocyte: 32.4 %
WBC: 7.8 10*3/uL (ref 3.8–10.8)

## 2022-07-16 LAB — COMPLETE METABOLIC PANEL WITH GFR
AG Ratio: 1.4 (calc) (ref 1.0–2.5)
ALT: 27 U/L (ref 9–46)
AST: 29 U/L (ref 10–35)
Albumin: 4.2 g/dL (ref 3.6–5.1)
Alkaline phosphatase (APISO): 64 U/L (ref 35–144)
BUN: 21 mg/dL (ref 7–25)
CO2: 24 mmol/L (ref 20–32)
Calcium: 9.4 mg/dL (ref 8.6–10.3)
Chloride: 104 mmol/L (ref 98–110)
Creat: 1.12 mg/dL (ref 0.70–1.30)
Globulin: 2.9 g/dL (calc) (ref 1.9–3.7)
Glucose, Bld: 116 mg/dL — ABNORMAL HIGH (ref 65–99)
Potassium: 3.8 mmol/L (ref 3.5–5.3)
Sodium: 139 mmol/L (ref 135–146)
Total Bilirubin: 1 mg/dL (ref 0.2–1.2)
Total Protein: 7.1 g/dL (ref 6.1–8.1)
eGFR: 77 mL/min/{1.73_m2} (ref >=60)

## 2022-07-16 NOTE — Progress Notes (Signed)
Glucose is 116. Rest of CMP WNL.  CBC WNL.

## 2022-08-02 ENCOUNTER — Other Ambulatory Visit: Payer: Self-pay | Admitting: Rheumatology

## 2022-08-04 NOTE — Telephone Encounter (Signed)
Next Visit: 12/16/2022  Last Visit: 07/15/2022  Last Fill: 07/10/2022 (30 day supply)  DX: Rheumatoid arthritis involving multiple sites with positive rheumatoid factor   Current Dose per office note 07/15/2022: methotrexate 7 tablets by mouth once weekly  Labs: 07/15/2022 Glucose is 116. Rest of CMP WNL.  CBC WNL.   Okay to refill MTX?

## 2022-09-08 ENCOUNTER — Other Ambulatory Visit: Payer: Self-pay | Admitting: Rheumatology

## 2022-10-27 ENCOUNTER — Telehealth: Payer: Self-pay | Admitting: Pharmacist

## 2022-10-27 DIAGNOSIS — Z79899 Other long term (current) drug therapy: Secondary | ICD-10-CM

## 2022-10-27 DIAGNOSIS — M0579 Rheumatoid arthritis with rheumatoid factor of multiple sites without organ or systems involvement: Secondary | ICD-10-CM

## 2022-10-27 NOTE — Telephone Encounter (Signed)
Received PAP renewal application from [employer] for [name]. Enrollment expired on 09/10/2022. His plan through [employer] has all biologics as plan exclusion  Left VM for patient requesting return call regarding clarification on if he's submitted his part of application  Placed application in Sherron Ales, PA-C's folder for signature today.  Chesley Mires, PharmD, MPH, BCPS, CPP Clinical Pharmacist (Rheumatology and Pulmonology)

## 2022-10-29 ENCOUNTER — Telehealth: Payer: Self-pay | Admitting: *Deleted

## 2022-10-29 DIAGNOSIS — Z79899 Other long term (current) drug therapy: Secondary | ICD-10-CM

## 2022-10-29 NOTE — Telephone Encounter (Signed)
Patient is OVERDUE for labs as I wrote in my previous note. I again called him back but phone went ot voicemail. I cannot send in the provider portion until he updates his labs as this is a formal prescription  Chesley Mires, PharmD, MPH, BCPS, CPP Clinical Pharmacist (Rheumatology and Pulmonology)

## 2022-10-29 NOTE — Telephone Encounter (Signed)
Patient contacted the office and left message stating he is reapplying for his [name] through [facility]. Patient states he would like to know if our portion has been completed and ready to be sent in. Please reach out to the patient .

## 2022-10-29 NOTE — Telephone Encounter (Addendum)
ATC patient again regarding [name] patient assistance application. Phone went straight to VM. Left VM requesting return call to determine if he's submitted his portion of application  Called [employer] to determine if patient has returned his part of application. They have received patient portion (patient completed online). However he is overdue for labs at this point as well.   Will try him again one more time next week  Ricky Hardin, PharmD, MPH, BCPS, CPP Clinical Pharmacist (Rheumatology and Pulmonology)

## 2022-10-30 ENCOUNTER — Telehealth: Payer: Self-pay | Admitting: Rheumatology

## 2022-10-30 NOTE — Telephone Encounter (Signed)
ATC patient again. Phone goes straight to VM. I will not be reaching out again as his phone goes straight to VM each time  Chesley Mires, PharmD, MPH, BCPS, CPP Clinical Pharmacist (Rheumatology and Pulmonology)

## 2022-10-30 NOTE — Telephone Encounter (Signed)
Patient calling to advise he has turned information in for application for [facility] and pharmacy is waiting on our office. Can be reached at [phone].

## 2022-10-30 NOTE — Telephone Encounter (Signed)
Patient while in office requested to speak with [facility] regarding his [name] application. Patient states he just wants to make sure he is set to receive his [name] when he is due. Patient just wants confirmation that everything is set.

## 2022-10-30 NOTE — Telephone Encounter (Signed)
Patient had labs completed today. Prescriber form and letter showing patient's insurance does not cover [name] has been sent to [employer]. Will fax tomorrow once labs have resulted  Chesley Mires, PharmD, MPH, BCPS, CPP Clinical Pharmacist (Rheumatology and Pulmonology)

## 2022-10-30 NOTE — Telephone Encounter (Signed)
Patient is OVERDUE for labs. Phone went straight to VM again. I am unable to send provider portion until patient updates labs. Please be advised when he calls back to reiterate this. He has not picked up my phone  Chesley Mires, PharmD, MPH, BCPS, CPP Clinical Pharmacist (Rheumatology and Pulmonology)

## 2022-10-30 NOTE — Addendum Note (Signed)
Addended by: Henriette Combs on: 10/30/2022 10:41 AM   Modules accepted: Orders

## 2022-10-31 LAB — COMPLETE METABOLIC PANEL WITH GFR
AG Ratio: 1.5 (calc) (ref 1.0–2.5)
ALT: 19 U/L (ref 9–46)
AST: 20 U/L (ref 10–35)
Albumin: 4.3 g/dL (ref 3.6–5.1)
Alkaline phosphatase (APISO): 65 U/L (ref 35–144)
BUN: 17 mg/dL (ref 7–25)
CO2: 26 mmol/L (ref 20–32)
Calcium: 9.4 mg/dL (ref 8.6–10.3)
Chloride: 107 mmol/L (ref 98–110)
Creat: 1.02 mg/dL (ref 0.70–1.30)
Globulin: 2.8 g/dL (calc) (ref 1.9–3.7)
Glucose, Bld: 91 mg/dL (ref 65–99)
Potassium: 4.3 mmol/L (ref 3.5–5.3)
Sodium: 139 mmol/L (ref 135–146)
Total Bilirubin: 0.8 mg/dL (ref 0.2–1.2)
Total Protein: 7.1 g/dL (ref 6.1–8.1)
eGFR: 86 mL/min/{1.73_m2} (ref >=60)

## 2022-10-31 LAB — CBC WITH DIFFERENTIAL/PLATELET
Absolute Monocytes: 642 cells/uL (ref 200–950)
Basophils Absolute: 69 cells/uL (ref 0–200)
Basophils Relative: 1 %
Eosinophils Absolute: 193 cells/uL (ref 15–500)
Eosinophils Relative: 2.8 %
HCT: 44.5 % (ref 38.5–50.0)
Hemoglobin: 15.2 g/dL (ref 13.2–17.1)
Lymphs Abs: 2663 cells/uL (ref 850–3900)
MCH: 30.8 pg (ref 27.0–33.0)
MCHC: 34.2 g/dL (ref 32.0–36.0)
MCV: 90.1 fL (ref 80.0–100.0)
MPV: 12 fL (ref 7.5–12.5)
Monocytes Relative: 9.3 %
Neutro Abs: 3333 cells/uL (ref 1500–7800)
Neutrophils Relative %: 48.3 %
Platelets: 243 10*3/uL (ref 140–400)
RBC: 4.94 10*6/uL (ref 4.20–5.80)
RDW: 12.9 % (ref 11.0–15.0)
Total Lymphocyte: 38.6 %
WBC: 6.9 10*3/uL (ref 3.8–10.8)

## 2022-10-31 NOTE — Progress Notes (Signed)
CBC and CMP are normal.

## 2022-10-31 NOTE — Telephone Encounter (Signed)
CBC CMP wnl. Provider portion of application faxed to [employer] today  Chesley Mires, PharmD, MPH, BCPS, CPP Clinical Pharmacist (Rheumatology and Pulmonology)

## 2022-11-06 ENCOUNTER — Other Ambulatory Visit: Payer: Self-pay | Admitting: Physician Assistant

## 2022-11-06 ENCOUNTER — Other Ambulatory Visit: Payer: Self-pay | Admitting: Rheumatology

## 2022-11-06 NOTE — Telephone Encounter (Signed)
Next Visit: 12/16/2022   Last Visit: 07/15/2022   Last Fill: 01/15/2022  DX: Rheumatoid arthritis involving multiple sites with positive rheumatoid factor    Current Dose per office note 07/15/2022: folic acid 2 mg daily   Okay to refill Folic Acid?

## 2022-11-06 NOTE — Telephone Encounter (Signed)
Next Visit: 12/16/2022   Last Visit: 07/15/2022   Last Fill: 08/04/2022   DX: Rheumatoid arthritis involving multiple sites with positive rheumatoid factor    Current Dose per office note 07/15/2022: methotrexate 7 tablets by mouth once weekly   Labs: 10/30/2022 CBC and CMP are normal.   Okay to refill MTX?

## 2022-11-19 ENCOUNTER — Other Ambulatory Visit (HOSPITAL_COMMUNITY): Payer: Self-pay

## 2022-11-19 NOTE — Telephone Encounter (Signed)
Patient returned call. He states he spoke with insurance and they advised that [name] can be covered with PA approval now.   Insurance information remains the same as before: BIN: [id number] PCN: [id number] ID: [id number] Group: [id number]  Submitted an URGENT Prior Authorization request to  [employer]  for [name] via CoverMyMeds. Will update once we receive a response.  Key: [id number]  Patient to stop by clinic this afternoon to pick up [name] sample from fridge as he is overdue.  Knox Saliva, PharmD, MPH, BCPS, CPP Clinical Pharmacist (Rheumatology and Pulmonology)

## 2022-11-19 NOTE — Telephone Encounter (Signed)
Patient states he called [facility] and was told his application was denied. Received fax from [employer] stating that patient is enrolled into an alternate funding plan that makes him ineligible for patient assistance.  I asked patient to call me back with most updated insurance information so we can recheck benefits. He states he will have to find that information. Provided him with my direct number to reach back out  Knox Saliva, PharmD, MPH, BCPS, CPP Clinical Pharmacist (Rheumatology and Pulmonology)

## 2022-11-19 NOTE — Telephone Encounter (Signed)
Medication Samples have been provided to the patient.  Drug name: [name]       Strength: 40 mg        Qty: 1 LOT: [id number]  Exp.Date: [id number]  Dosing instructions: Inject one pen into skin every 14 days.

## 2022-11-25 NOTE — Telephone Encounter (Signed)
Called [employer] for update on [name] PA. Per rep, patient's PA request has been downgraded and PA turnaround time is now standard time frame (14 days - 12/04/2022)  Phone: [phone]  Knox Saliva, PharmD, MPH, BCPS, CPP Clinical Pharmacist (Rheumatology and Pulmonology)

## 2022-11-28 ENCOUNTER — Telehealth: Payer: Self-pay | Admitting: *Deleted

## 2022-11-28 NOTE — Telephone Encounter (Signed)
Patient returned call to the office. Patient advised the PA has been downgraded and the turn around time is 14 days. Patient states he may need another sample. Patient advised that if his injection becomes due and he has not heard anything then to call the office for a sample.

## 2022-11-28 NOTE — Telephone Encounter (Signed)
Patient contacted the office and left message asking about the status of the PA for [name].   Attempted to contact the patient and left message for patient to call the office.

## 2022-12-02 NOTE — Telephone Encounter (Signed)
Received a fax regarding Prior Authorization from [employer] for [place]. Authorization has been DENIED because patient must try and fail [name] (BIOSIMILAR that is not interchangeable). Authorization is approved through 05/20/2023  Patient must fill through [facility] to Dr. Annett Fabian for okay to change  Knox Saliva, PharmD, MPH, BCPS, CPP Clinical Pharmacist (Rheumatology and Pulmonology)

## 2022-12-02 NOTE — Telephone Encounter (Signed)
Okay to change if patient is in agreement.

## 2022-12-02 NOTE — Telephone Encounter (Signed)
Patient will have to call [facility] at 1-800-[name] [phone]) to enroll into savings card  Knox Saliva, PharmD, MPH, BCPS, CPP Clinical Pharmacist (Rheumatology and Pulmonology)

## 2022-12-02 NOTE — Progress Notes (Signed)
Office Visit Note  Patient: Ricky Hardin             Date of Birth: Oct 23, 1966           MRN: [id number]             PCP: [facility] Referring: [facility] Visit Date: 12/16/2022 Occupation: @GUAROCC @  Subjective:  Medication management  History of Present Illness: Ricky Hardin is a 57 y.o. male with history of seropositive rheumatoid arthritis and osteoarthritis.  He was switched to [facility] January 2024 due to the insurance reasons.  He took his first [name] injection 2 weeks ago which he tolerated well.  He has been taking methotrexate 7 tablets p.o. weekly without any side effects.  He denies any interruption in the therapy.  He has been having some discomfort in his left wrist joint and his feet intermittently.  He denies any discomfort today.  Denies any shortness of breath or any eye inflammation.    Activities of Daily Living:  Patient reports morning stiffness for 15 minutes.   Patient Denies nocturnal pain.  Difficulty dressing/grooming: Denies Difficulty climbing stairs: Denies Difficulty getting out of chair: Denies Difficulty using hands for taps, buttons, cutlery, and/or writing: Denies  Review of Systems  Constitutional: Negative.  Negative for fatigue.  HENT: Negative.  Negative for mouth sores and mouth dryness.   Eyes:  Positive for dryness.  Respiratory: Negative.  Negative for shortness of breath.   Cardiovascular: Negative.  Negative for chest pain and palpitations.  Gastrointestinal: Negative.  Negative for blood in stool, constipation and diarrhea.  Endocrine: Negative.  Negative for increased urination.  Genitourinary: Negative.  Negative for involuntary urination.  Musculoskeletal:  Positive for joint pain, joint pain and morning stiffness. Negative for joint swelling, myalgias, muscle weakness, muscle tenderness and myalgias.  Skin: Negative.  Negative for pallor, rash, hair loss and sensitivity to sunlight.   Allergic/Immunologic: Negative.  Negative for susceptible to infections.  Neurological:  Positive for headaches. Negative for dizziness.  Hematological: Negative.  Negative for swollen glands.  Psychiatric/Behavioral: Negative.  Negative for depressed mood and sleep disturbance. The patient is not nervous/anxious.     PMFS History:  Patient Active Problem List   Diagnosis Date Noted   RA (rheumatoid arthritis) (HCC) 09/19/2016   Osteoarthritis of both hands 09/19/2016   Osteoarthritis of both feet 09/19/2016    Past Medical History:  Diagnosis Date   Osteoarthritis of both feet 09/19/2016   Osteoarthritis of both hands 09/19/2016   RA (rheumatoid arthritis) (HCC) 09/19/2016   +RF, +CCP Erosive disease    Family History  Problem Relation Age of Onset   Colon polyps Mother    Heart disease Father 68   Rheum arthritis Sister    Colon cancer Neg Hx    Esophageal cancer Neg Hx    Stomach cancer Neg Hx    Rectal cancer Neg Hx    Past Surgical History:  Procedure Laterality Date   COLONOSCOPY  06/2020   Social History   Social History Narrative   Not on file    There is no immunization history on file for this patient.   Objective: Vital Signs: BP 118/78 (BP Location: Left Arm, Patient Position: Sitting, Cuff Size: Normal)   Pulse (!) 125   Ht 5\' 10"  (1.778 m)   Wt 191 lb 9.6 oz (86.9 kg)   BMI 27.49 kg/m    Physical Exam Vitals and nursing note reviewed.  Constitutional:  Appearance: He is well-developed.  HENT:     Head: Normocephalic and atraumatic.  Eyes:     Conjunctiva/sclera: Conjunctivae normal.     Pupils: Pupils are equal, round, and reactive to light.  Cardiovascular:     Rate and Rhythm: Normal rate and regular rhythm.     Heart sounds: Normal heart sounds.  Pulmonary:     Effort: Pulmonary effort is normal.     Breath sounds: Normal breath sounds.  Abdominal:     General: Bowel sounds are normal.     Palpations: Abdomen is soft.   Musculoskeletal:     Cervical back: Normal range of motion and neck supple.  Skin:    General: Skin is warm and dry.     Capillary Refill: Capillary refill takes less than 2 seconds.  Neurological:     Mental Status: He is alert and oriented to person, place, and time.  Psychiatric:        Behavior: Behavior normal.      Musculoskeletal Exam: Cervical, thoracic and lumbar spine were in good range of motion.  Shoulders, elbows, wrist joints, MCPs PIPs and DIPs been good range of motion with no synovitis.  He had bilateral PIP and DIP thickening.  Hip joints and knee joints in good range of motion.  He had no tenderness over ankles or MTPs.  CDAI Exam: CDAI Score: -- Patient Global: 2 mm; Provider Global: 2 mm Swollen: --; Tender: -- Joint Exam 12/16/2022   No joint exam has been documented for this visit   There is currently no information documented on the homunculus. Go to the Rheumatology activity and complete the homunculus joint exam.  Investigation: No additional findings.  Imaging: No results found.  Recent Labs: Lab Results  Component Value Date   WBC 6.9 10/30/2022   HGB 15.2 10/30/2022   PLT 243 10/30/2022   NA 139 10/30/2022   K 4.3 10/30/2022   CL 107 10/30/2022   CO2 26 10/30/2022   GLUCOSE 91 10/30/2022   BUN 17 10/30/2022   CREATININE 1.02 10/30/2022   BILITOT 0.8 10/30/2022   ALKPHOS 83 05/05/2017   AST 20 10/30/2022   ALT 19 10/30/2022   PROT 7.1 10/30/2022   ALBUMIN 4.3 05/05/2017   CALCIUM 9.4 10/30/2022   GFRAA 95 02/12/2021   QFTBGOLDPLUS NEGATIVE 04/17/2022    Speciality Comments: [name] 10/19-01/24, [name] 01/24-  Procedures:  No procedures performed Allergies: Patient has no known allergies.   Assessment / Plan:     Visit Diagnoses: Rheumatoid arthritis involving multiple sites with positive rheumatoid factor (HCC)-he has been doing well on the combination of [name] and methotrexate over the years.  He was switched from [name] to  a bio similar [name] in January 2024.  He took the first [name] injection in January and tolerated it well.  He continues to take methotrexate 7 tablets p.o. weekly.  He states he has intermittent discomfort in his left wrist joint and in his feet.  He denies any discomfort or swelling today.  He is last x-rays of bilateral hands and feet were in 2020.  I advised repeat x-rays today.  Patient would like to wait until the next visit.  High risk medication use - [name] ([name] biosimilar) 40 mg sq injections every 14 days, methotrexate 7 tablets by mouth once weekly, and folic acid 2 mg daily.10/30/22 CBC and CMP were normal. 04/17/22 TB.  He was advised to get repeat labs in March and then every 3 months to monitor for  drug toxicity.  TB Gold will be repeated in June 2024.  Information on immunization was placed in the AVS.  He was advised to hold [name] and methotrexate if he develops an infection and resume after the infection resolves.  Use of sunscreen was advised.  Annual skin examination to screen for skin cancers was advised.  Primary osteoarthritis of both hands-he had lateral PIP and DIP thickening consistent with osteoarthritis.  No synovitis was noted.  Joint protection muscle strengthening was discussed.  Primary osteoarthritis of both feet-he has intermittent discomfort in his feet.  No tenderness was noted.  Proper fitting shoes were advised.  Will obtain x-rays at the next visit.  Family history of heart disease-increased risk of disease with rheumatoid arthritis was discussed.  Patient is very active.  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  Orders: No orders of the defined types were placed in this encounter.  No orders of the defined types were placed in this encounter.    Follow-Up Instructions: Return in about 5 months (around 05/16/2023) for Rheumatoid arthritis, Osteoarthritis.   Bo Merino, MD  Note - This record has been created using [employer].  Chart creation errors have been sought, but may not always  have been located. Such creation errors do not reflect on  the standard of medical care.

## 2022-12-03 ENCOUNTER — Telehealth: Payer: Self-pay | Admitting: *Deleted

## 2022-12-03 NOTE — Telephone Encounter (Signed)
Patient returned call and requested a call back. 

## 2022-12-03 NOTE — Telephone Encounter (Signed)
ATC patient regarding switching to [name] from [name]. LVM for patient to call us back.  Maryan Puls, PharmD PGY-1 [facility] Resident

## 2022-12-03 NOTE — Telephone Encounter (Signed)
Patient contacted the office and left message stating he is call for an update on his [name]. Patient states he is due to take his [name] again and would like to know how to proceed. Call back number [phone].

## 2022-12-04 MED ORDER — YUSIMRY 40 MG/0.8ML ~~LOC~~ SOPN: 40.0000 mg | PEN_INJECTOR | SUBCUTANEOUS | 2 refills | Status: DC

## 2022-12-04 NOTE — Addendum Note (Signed)
Addended by: Cassandria Anger on: 12/04/2022 11:16 AM   Modules accepted: Orders

## 2022-12-04 NOTE — Addendum Note (Signed)
Addended by: Cassandria Anger on: 12/04/2022 10:07 AM   Modules accepted: Orders

## 2022-12-04 NOTE — Telephone Encounter (Addendum)
Patient returned call regarding switch to [name] biosimilar ([name]). We reviewed biosimilars and they should be as effective and no more harmful then originator product. Advised that biosimilar studies include studying patients who are switching from originator to biosimilar. Reviewed that biosimilars will be approved once it is shown that patient disease activity remains as stable while as while on [name]. He is in agreement to try.  He will sign up for copay card then sign up for [employer]. Rx has been sent to [facility] with patient's email included in directions and rx notes to link rx to his account.  Patient to reach back out if any issues or concerns.  Knox Saliva, PharmD, MPH, BCPS, CPP Clinical Pharmacist (Rheumatology and Pulmonology)

## 2022-12-10 NOTE — Telephone Encounter (Signed)
Spoke with [employer]. [name] medication was shipped out on 12/09/2022 and tracking shows it will be delivered today.    Maryan Puls, PharmD PGY-1 [facility] Pharmacy Resident

## 2022-12-16 ENCOUNTER — Ambulatory Visit: Payer: Commercial Managed Care - PPO | Attending: Rheumatology | Admitting: Rheumatology

## 2022-12-16 ENCOUNTER — Encounter: Payer: Self-pay | Admitting: Rheumatology

## 2022-12-16 VITALS — BP 118/78 | HR 125 | Ht 70.0 in | Wt 191.6 lb

## 2022-12-16 DIAGNOSIS — M19071 Primary osteoarthritis, right ankle and foot: Secondary | ICD-10-CM

## 2022-12-16 DIAGNOSIS — M0579 Rheumatoid arthritis with rheumatoid factor of multiple sites without organ or systems involvement: Secondary | ICD-10-CM | POA: Diagnosis not present

## 2022-12-16 DIAGNOSIS — M19041 Primary osteoarthritis, right hand: Secondary | ICD-10-CM

## 2022-12-16 DIAGNOSIS — Z79899 Other long term (current) drug therapy: Secondary | ICD-10-CM

## 2022-12-16 DIAGNOSIS — M19042 Primary osteoarthritis, left hand: Secondary | ICD-10-CM

## 2022-12-16 DIAGNOSIS — M19072 Primary osteoarthritis, left ankle and foot: Secondary | ICD-10-CM

## 2022-12-16 DIAGNOSIS — Z8249 Family history of ischemic heart disease and other diseases of the circulatory system: Secondary | ICD-10-CM

## 2022-12-16 NOTE — Patient Instructions (Signed)
Standing Labs We placed an order today for your standing lab work.   Please have your standing labs drawn in March and every 3 months  Please have your labs drawn 2 weeks prior to your appointment so that the provider can discuss your lab results at your appointment.  Please note that you may see your imaging and lab results in [facility] before we have reviewed them. We will contact you once all results are reviewed. Please allow our office up to 72 hours to thoroughly review all of the results before contacting the office for clarification of your results.  Lab hours are:   Monday through Thursday from 8:00 am -12:30 pm and 1:00 pm-5:00 pm and Friday from 8:00 am-12:00 pm.  Please be advised, all patients with office appointments requiring lab work will take precedent over walk-in lab work.   Labs are drawn by [facility]. Please bring your co-pay at the time of your lab draw.  You may receive a bill from [facility] for your lab work.  Please note if you are on Hydroxychloroquine and and an order has been placed for a Hydroxychloroquine level, you will need to have it drawn 4 hours or more after your last dose.  If you wish to have your labs drawn at another location, please call the office 24 hours in advance so we can fax the orders.  The office is located at [address], [place], [place], [place] [address] No appointment is necessary.    If you have any questions regarding directions or hours of operation,  please call [phone].   As a reminder, please drink plenty of water prior to coming for your lab work. Thanks!   Vaccines You are taking a medication(s) that can suppress your immune system.  The following immunizations are recommended: Flu annually Covid-19  RSV Td/Tdap (tetanus, diphtheria, pertussis) every 10 years Pneumonia (Prevnar 15 then Pneumovax 23 at least 1 year apart.  Alternatively, can take Prevnar 20 without needing additional dose) Shingrix: 2 doses from 4  weeks to 6 months apart  Please check with your PCP to make sure you are up to date.   If you have signs or symptoms of an infection or start antibiotics: First, call your PCP for workup of your infection. Hold your medication through the infection, until you complete your antibiotics, and until symptoms resolve if you take the following: Injectable medication (Actemra, Benlysta, Cimzia, Cosentyx, Enbrel, [name], Kevzara, Orencia, Remicade, Simponi, Stelara, Taltz, Tremfya) Methotrexate Leflunomide (Arava) Mycophenolate (Cellcept) Morrie Sheldon, Olumiant, or Rinvoq  Please get an annual skin examination to screen for skin cancer while you are on [name]

## 2023-01-29 ENCOUNTER — Other Ambulatory Visit: Payer: Self-pay | Admitting: Physician Assistant

## 2023-01-29 NOTE — Telephone Encounter (Signed)
Last Fill: 11/06/2022  Labs: 10/30/2022  Next Visit: 05/11/2023  Last Visit: 12/16/2022  DX: Rheumatoid arthritis involving multiple sites with positive rheumatoid factor ([place])   Current Dose per office note 12/16/2022: methotrexate 7 tablets p.o. weekly.   Attempted to contact the patient and left a message that he is due to update labs.  Okay to refill Methotrexate?

## 2023-02-05 ENCOUNTER — Other Ambulatory Visit: Payer: Self-pay | Admitting: *Deleted

## 2023-02-05 DIAGNOSIS — Z79899 Other long term (current) drug therapy: Secondary | ICD-10-CM

## 2023-02-06 LAB — COMPLETE METABOLIC PANEL WITH GFR
AG Ratio: 1.6 (calc) (ref 1.0–2.5)
ALT: 44 U/L (ref 9–46)
AST: 24 U/L (ref 10–35)
Albumin: 4.3 g/dL (ref 3.6–5.1)
Alkaline phosphatase (APISO): 55 U/L (ref 35–144)
BUN: 22 mg/dL (ref 7–25)
CO2: 27 mmol/L (ref 20–32)
Calcium: 9.5 mg/dL (ref 8.6–10.3)
Chloride: 106 mmol/L (ref 98–110)
Creat: 1.11 mg/dL (ref 0.70–1.30)
Globulin: 2.7 g/dL (calc) (ref 1.9–3.7)
Glucose, Bld: 89 mg/dL (ref 65–99)
Potassium: 4.3 mmol/L (ref 3.5–5.3)
Sodium: 139 mmol/L (ref 135–146)
Total Bilirubin: 0.8 mg/dL (ref 0.2–1.2)
Total Protein: 7 g/dL (ref 6.1–8.1)
eGFR: 77 mL/min/{1.73_m2} (ref >=60)

## 2023-02-06 LAB — CBC WITH DIFFERENTIAL/PLATELET
Absolute Monocytes: 770 cells/uL (ref 200–950)
Basophils Absolute: 57 cells/uL (ref 0–200)
Basophils Relative: 0.7 %
Eosinophils Absolute: 203 cells/uL (ref 15–500)
Eosinophils Relative: 2.5 %
HCT: 43.6 % (ref 38.5–50.0)
Hemoglobin: 14.9 g/dL (ref 13.2–17.1)
Lymphs Abs: 2876 cells/uL (ref 850–3900)
MCH: 30.2 pg (ref 27.0–33.0)
MCHC: 34.2 g/dL (ref 32.0–36.0)
MCV: 88.4 fL (ref 80.0–100.0)
MPV: 12.2 fL (ref 7.5–12.5)
Monocytes Relative: 9.5 %
Neutro Abs: 4196 cells/uL (ref 1500–7800)
Neutrophils Relative %: 51.8 %
Platelets: 247 10*3/uL (ref 140–400)
RBC: 4.93 10*6/uL (ref 4.20–5.80)
RDW: 13.1 % (ref 11.0–15.0)
Total Lymphocyte: 35.5 %
WBC: 8.1 10*3/uL (ref 3.8–10.8)

## 2023-02-06 NOTE — Progress Notes (Signed)
CBC and CMP are normal.

## 2023-02-12 ENCOUNTER — Telehealth: Payer: Self-pay | Admitting: *Deleted

## 2023-02-12 DIAGNOSIS — M0579 Rheumatoid arthritis with rheumatoid factor of multiple sites without organ or systems involvement: Secondary | ICD-10-CM

## 2023-02-12 DIAGNOSIS — Z79899 Other long term (current) drug therapy: Secondary | ICD-10-CM

## 2023-02-12 NOTE — Telephone Encounter (Signed)
Patient contacted the office stating he needs to speak with the pharmacist regarding insurance and [name] ([name] biosimilar) 40 mg sq injections every 14 days.

## 2023-02-16 NOTE — Telephone Encounter (Signed)
Returned call to patient - phone went straight to VM. Left VM requesting returning call  Chesley Mires, PharmD, MPH, BCPS, CPP Clinical Pharmacist (Rheumatology and Pulmonology)

## 2023-02-16 NOTE — Telephone Encounter (Signed)
Patient returned call - he states that he will have a new insurance plan through [facility] as of 03/11/23. He was advised that Marda Stalker is not covered but his insurance agent is still looking into what [name] or biosimilars are covered. He will let us know when he gets new insurance information and if he learns anything from his insurance agent about adalimumab preferred options  Chesley Mires, PharmD, MPH, BCPS, CPP Clinical Pharmacist (Rheumatology and Pulmonology)

## 2023-03-06 ENCOUNTER — Other Ambulatory Visit: Payer: Self-pay | Admitting: *Deleted

## 2023-03-06 DIAGNOSIS — Z79899 Other long term (current) drug therapy: Secondary | ICD-10-CM

## 2023-03-06 DIAGNOSIS — M0579 Rheumatoid arthritis with rheumatoid factor of multiple sites without organ or systems involvement: Secondary | ICD-10-CM

## 2023-03-06 MED ORDER — YUSIMRY 40 MG/0.8ML ~~LOC~~ SOPN: 40.0000 mg | PEN_INJECTOR | SUBCUTANEOUS | 0 refills | Status: DC

## 2023-03-06 NOTE — Telephone Encounter (Signed)
Last Fill: 12/04/2022  Labs: 02/05/2023 CBC and CMP are normal.   TB Gold: 04/17/2022  TB gold negative.   Next Visit: 05/11/2023  Last Visit: 12/16/2022  DX: Rheumatoid arthritis involving multiple sites with positive rheumatoid factor   Current Dose per office note 12/16/2022: [name] ([name] biosimilar) 40 mg sq injections every 14 days,   Okay to refill [name]?

## 2023-03-11 ENCOUNTER — Other Ambulatory Visit (HOSPITAL_COMMUNITY): Payer: Self-pay

## 2023-03-16 ENCOUNTER — Telehealth: Payer: Self-pay

## 2023-03-16 NOTE — Telephone Encounter (Signed)
Patient contacted the office and states he needs to get a refill on [name] but has changed his insurance since the last refill. Patient states he changed to [employer]. Patient call back number is [phone]. Please advise.

## 2023-03-18 ENCOUNTER — Other Ambulatory Visit (HOSPITAL_COMMUNITY): Payer: Self-pay

## 2023-03-18 ENCOUNTER — Telehealth: Payer: Self-pay

## 2023-03-18 NOTE — Telephone Encounter (Signed)
Clinical questions for [name] PA Completed on [facility]  Key: [id number]  Chesley Mires, PharmD, MPH, BCPS, CPP Clinical Pharmacist (Rheumatology and Pulmonology)

## 2023-03-18 NOTE — Telephone Encounter (Signed)
ATC patient regarding new insurance information   Chesley Mires, PharmD, MPH, BCPS, CPP Clinical Pharmacist (Rheumatology and Pulmonology)

## 2023-03-18 NOTE — Telephone Encounter (Signed)
Christine from [employer] contacted the office and states they received a Prior Authorization for the patient's [name]. Wynona Canes states the PA was denied by medical record and that the medication was not on the formulary. Wynona Canes states the medication was switched to [name] due to coverage and is stable. Wynona Canes states that [name] is the preferred medication. Call back number is [phone]. Please advise.

## 2023-03-18 NOTE — Telephone Encounter (Signed)
   Submitted a Prior Authorization request to [facility] for  [name]  via CoverMyMeds. Pending clinical questions to populate  Key: [id number]  Chesley Mires, PharmD, MPH, BCPS, CPP Clinical Pharmacist (Rheumatology and Pulmonology)

## 2023-03-19 ENCOUNTER — Other Ambulatory Visit (HOSPITAL_COMMUNITY): Payer: Self-pay

## 2023-03-19 NOTE — Telephone Encounter (Signed)
Clinical questions for [name] PA completed on CMM  Chesley Mires, PharmD, MPH, BCPS, CPP Clinical Pharmacist (Rheumatology and Pulmonology)

## 2023-03-19 NOTE — Telephone Encounter (Signed)
Received notification from [facility] regarding a prior authorization for [name]. Authorization has been APPROVED from 03/19/23 to 03/18/24. Approval letter sent to scan center.  Patient can fill through [facility]: [phone]   Authorization # [id number]  Pending copay card information to populate in [employer] portal  Chesley Mires, PharmD, MPH, BCPS, CPP Clinical Pharmacist (Rheumatology and Pulmonology)

## 2023-03-19 NOTE — Telephone Encounter (Signed)
Received a fax regarding Prior Authorization from [facility] for  [name] . Authorization has been DENIED because patient must have tried and failed all preferred adalimumab biosimilar products ([name], Amjevita, Hadlima)  Will run PA for [name]. Submitted a Prior Authorization request to [facility] for [name] via CoverMyMeds. Pending clinical questions to populate  Key: [id number]   Chesley Mires, PharmD, MPH, BCPS, CPP Clinical Pharmacist (Rheumatology and Pulmonology)

## 2023-03-20 NOTE — Telephone Encounter (Signed)
ATC patient regarding [name] approval and still waiting for copay card information to populate. Left VM advising that we will reach out early next week  Chesley Mires, PharmD, MPH, BCPS, CPP Clinical Pharmacist (Rheumatology and Pulmonology)

## 2023-03-24 ENCOUNTER — Other Ambulatory Visit: Payer: Self-pay

## 2023-03-24 ENCOUNTER — Other Ambulatory Visit (HOSPITAL_COMMUNITY): Payer: Self-pay

## 2023-03-24 MED ORDER — HUMIRA (2 PEN) 40 MG/0.4ML ~~LOC~~ AJKT
40.0000 mg | AUTO-INJECTOR | SUBCUTANEOUS | 1 refills | Status: DC
  Filled 2023-03-24: qty 2, fill #0
  Filled 2023-03-25: qty 2, 28d supply, fill #0
  Filled 2023-04-15 – 2023-05-04 (×2): qty 2, 28d supply, fill #1

## 2023-03-24 NOTE — Telephone Encounter (Signed)
[name] copay card: ID: [id number] Issued:03/23/2023 Rx GROUP: [id number] Rx BIN: [id number] Rx PCN: [id number]  Rx for [name] sent to [facility]. Routing to Xenia M for onboarding needs  Chesley Mires, PharmD, MPH, BCPS, CPP Clinical Pharmacist (Rheumatology and Pulmonology)

## 2023-03-24 NOTE — Addendum Note (Signed)
Addended by: Murrell Redden on: 03/24/2023 10:15 AM   Modules accepted: Orders

## 2023-03-25 ENCOUNTER — Other Ambulatory Visit: Payer: Self-pay

## 2023-03-25 ENCOUNTER — Other Ambulatory Visit (HOSPITAL_COMMUNITY): Payer: Self-pay

## 2023-03-25 NOTE — Telephone Encounter (Signed)
Delivery instructions have been updated in [place], medication will be shipped to patient's home address on 03/26/23.  Rx has been processed in [facility] and  Copay card info has been provided to the pharmacy.

## 2023-03-26 ENCOUNTER — Other Ambulatory Visit: Payer: Self-pay

## 2023-03-26 ENCOUNTER — Other Ambulatory Visit (HOSPITAL_COMMUNITY): Payer: Self-pay

## 2023-04-07 ENCOUNTER — Other Ambulatory Visit (HOSPITAL_COMMUNITY): Payer: Self-pay

## 2023-04-15 ENCOUNTER — Other Ambulatory Visit (HOSPITAL_COMMUNITY): Payer: Self-pay

## 2023-04-21 ENCOUNTER — Other Ambulatory Visit: Payer: Self-pay | Admitting: Physician Assistant

## 2023-04-21 NOTE — Telephone Encounter (Signed)
Last Fill: 01/29/2023  Labs: 02/05/2023 CBC and CMP are normal.   Next Visit: 05/11/2023  Last Visit: 12/16/2022  DX: Rheumatoid arthritis involving multiple sites with positive rheumatoid factor   Current Dose per office note 12/16/2022: methotrexate 7 tablets p.o. weekly.   Okay to refill Methotrexate?

## 2023-04-22 ENCOUNTER — Other Ambulatory Visit: Payer: Self-pay

## 2023-04-22 ENCOUNTER — Other Ambulatory Visit (HOSPITAL_COMMUNITY): Payer: Self-pay

## 2023-04-27 NOTE — Progress Notes (Signed)
Office Visit Note  Patient: Ricky Hardin             Date of Birth: [birth year]           MRN: [id number]             PCP: [facility] Referring: [facility] Visit Date: 05/11/2023 Occupation: @GUAROCC @  Subjective:  Medication monitoring   History of Present Illness: Ricky Hardin is a 57 y.o. male with history of seropositive rheumatoid arthritis and osteoarthritis.  Patient remains on [name] 40 mg sq injections every 14 days, methotrexate 7 tablets by mouth once weekly, and folic acid 2 mg daily. Patient is tolerating combination therapy without any side effects.  He states he had several delays in injecting humira due to insurance preference/shipments, but he states he currently has enough humira on hand.  He denies any signs or symptoms of a flare.  He experiences some joint stiffness but has not noticed any joint swelling.  He has been experiencing plantar fasciitis in the left foot and has been trying to wear proper fitting shoes.  He continues to try to play tennis at least 3 days a week. He  denies any recent or recurrent infections.   Activities of Daily Living:  Patient reports morning stiffness for a few minutes.   Patient Denies nocturnal pain.  Difficulty dressing/grooming: Denies Difficulty climbing stairs: Denies Difficulty getting out of chair: Denies Difficulty using hands for taps, buttons, cutlery, and/or writing: Denies  Review of Systems  Constitutional:  Negative for fatigue.  HENT:  Negative for mouth sores and mouth dryness.   Eyes:  Negative for dryness.  Respiratory:  Negative for shortness of breath.   Cardiovascular:  Negative for chest pain and palpitations.  Gastrointestinal:  Negative for blood in stool, constipation and diarrhea.  Endocrine: Negative for increased urination.  Genitourinary:  Negative for involuntary urination.  Musculoskeletal:  Positive for morning stiffness. Negative for joint pain, gait problem,  joint pain, joint swelling, myalgias, muscle weakness, muscle tenderness and myalgias.  Skin:  Negative for color change, rash, hair loss and sensitivity to sunlight.  Allergic/Immunologic: Negative for susceptible to infections.  Neurological:  Negative for dizziness and headaches.  Hematological:  Negative for swollen glands.  Psychiatric/Behavioral:  Negative for depressed mood and sleep disturbance. The patient is not nervous/anxious.     PMFS History:  Patient Active Problem List   Diagnosis Date Noted   RA (rheumatoid arthritis) (HCC) 09/19/2016   Osteoarthritis of both hands 09/19/2016   Osteoarthritis of both feet 09/19/2016    Past Medical History:  Diagnosis Date   Osteoarthritis of both feet 09/19/2016   Osteoarthritis of both hands 09/19/2016   RA (rheumatoid arthritis) (HCC) 09/19/2016   +RF, +CCP Erosive disease    Family History  Problem Relation Age of Onset   Colon polyps Mother    Heart disease Father 31   Rheum arthritis Sister    Colon cancer Neg Hx    Esophageal cancer Neg Hx    Stomach cancer Neg Hx    Rectal cancer Neg Hx    Past Surgical History:  Procedure Laterality Date   COLONOSCOPY  06/2020   Social History   Social History Narrative   Not on file    There is no immunization history on file for this patient.   Objective: Vital Signs: BP 134/88 (BP Location: Left Arm, Patient Position: Sitting, Cuff Size: Normal)   Pulse (!) 56  Resp 16   Ht 5\' 10"  (1.778 m)   Wt 185 lb 12.8 oz (84.3 kg)   BMI 26.66 kg/m    Physical Exam Vitals and nursing note reviewed.  Constitutional:      Appearance: He is well-developed.  HENT:     Head: Normocephalic and atraumatic.  Eyes:     Conjunctiva/sclera: Conjunctivae normal.     Pupils: Pupils are equal, round, and reactive to light.  Cardiovascular:     Rate and Rhythm: Normal rate and regular rhythm.     Heart sounds: Normal heart sounds.  Pulmonary:     Effort: Pulmonary effort is normal.      Breath sounds: Normal breath sounds.  Abdominal:     General: Bowel sounds are normal.     Palpations: Abdomen is soft.  Musculoskeletal:     Cervical back: Normal range of motion and neck supple.  Skin:    General: Skin is warm and dry.     Capillary Refill: Capillary refill takes less than 2 seconds.  Neurological:     Mental Status: He is alert and oriented to person, place, and time.  Psychiatric:        Behavior: Behavior normal.      Musculoskeletal Exam: C-spine, thoracic spine, lumbar spine have good range of motion.  Shoulder joints and elbow joints have good range of motion.  Slightly limited extension of the left wrist.  No tenderness or synovitis over wrist joints or MCP joints.  PIP and DIP thickening consistent with osteoarthritis of both feet.  Hip joints have good range of motion with no groin pain.  Knee joints have good range of motion with no warmth or effusion.  Ankle joints have good range of motion with no tenderness or joint swelling.  CDAI Exam: CDAI Score: -- Patient Global: 10 / 100; Provider Global: 10 / 100 Swollen: --; Tender: -- Joint Exam 05/11/2023   No joint exam has been documented for this visit   There is currently no information documented on the homunculus. Go to the Rheumatology activity and complete the homunculus joint exam.  Investigation: No additional findings.  Imaging: No results found.  Recent Labs: Lab Results  Component Value Date   WBC 8.1 02/05/2023   HGB 14.9 02/05/2023   PLT 247 02/05/2023   NA 139 02/05/2023   K 4.3 02/05/2023   CL 106 02/05/2023   CO2 27 02/05/2023   GLUCOSE 89 02/05/2023   BUN 22 02/05/2023   CREATININE 1.11 02/05/2023   BILITOT 0.8 02/05/2023   ALKPHOS 83 05/05/2017   AST 24 02/05/2023   ALT 44 02/05/2023   PROT 7.0 02/05/2023   ALBUMIN 4.3 05/05/2017   CALCIUM 9.5 02/05/2023   GFRAA 95 02/12/2021   QFTBGOLDPLUS NEGATIVE 04/17/2022    Speciality Comments: [name] 10/19-01/24,  [name] 01/24-  Procedures:  No procedures performed Allergies: Patient has no known allergies.   Assessment / Plan:     Visit Diagnoses: Rheumatoid arthritis involving multiple sites with positive rheumatoid factor [facility]): He has no joint tenderness or synovitis on examination today.  He has not had any signs or symptoms of a rheumatoid arthritis flare.  He has clinically been doing well on [name] 40 mg subcu days injections every 14 days along with methotrexate 7 tablets by mouth once weekly.  Patient states he had several small delays in therapy due to changes with insurance.  He plans on now remaining on [name] without any interruption.  He does not need a  refill at this time. Patient was encouraged to try to follow a Mediterranean style or plant-based diet if possible.  He will remain on combination therapy as prescribed.  He was advised to notify us if he develops signs or symptoms of a flare. He will follow up in 5 months or sooner if needed.  High risk medication use - [name] 40 mg sq injections every 14 days, methotrexate 7 tablets by mouth once weekly, and folic acid 2 mg daily.  CBC and CMP updated on 02/05/23.  Orders for CBC and CMP released today.  His next lab work will be due in October and every 3 months to monitor for drug toxicity.  Standing orders for CBC and CMP remain in place. TB gold negative on 04/17/22.  Order for TB gold released today. No recent or recurrent infections.  Discussed the importance of holding [name] and methotrexate if he develops signs or symptoms of an infection and to resume once the infection has completely cleared.  - Plan: CBC with Differential/Platelet, COMPLETE METABOLIC PANEL WITH GFR, QuantiFERON-TB Gold Plus  Screening for tuberculosis - Order for TB gold released today. Plan: QuantiFERON-TB Gold Plus  Primary osteoarthritis of both hands: He has PIP and DIP thickening consistent with osteoarthritis of both hands.  No tenderness or inflammation noted  today.  Complete fist formation bilaterally.  Discussed the importance of joint protection and muscle strengthening.  Primary osteoarthritis of both feet: He has been experiencing intermittent episodes of plantar fasciitis in the left foot.  He continues to try to play tennis 3 days a week but has been performing stretching exercises for plantar fasciitis.  Discussed the importance of wearing proper fitting shoes.  Family history of heart disease  Orders: Orders Placed This Encounter  Procedures   CBC with Differential/Platelet   COMPLETE METABOLIC PANEL WITH GFR   QuantiFERON-TB Gold Plus   No orders of the defined types were placed in this encounter.   Follow-Up Instructions: Return in about 5 months (around 10/11/2023) for Rheumatoid arthritis, Osteoarthritis.   Gearldine Bienenstock, PA-C  Note - This record has been created using Dragon software.  Chart creation errors have been sought, but may not always  have been located. Such creation errors do not reflect on  the standard of medical care.

## 2023-04-30 ENCOUNTER — Other Ambulatory Visit (HOSPITAL_COMMUNITY): Payer: Self-pay

## 2023-05-04 ENCOUNTER — Other Ambulatory Visit: Payer: Self-pay

## 2023-05-11 ENCOUNTER — Encounter: Payer: Self-pay | Admitting: Physician Assistant

## 2023-05-11 ENCOUNTER — Ambulatory Visit: Payer: BC Managed Care – PPO | Attending: Physician Assistant | Admitting: Physician Assistant

## 2023-05-11 VITALS — BP 134/88 | HR 56 | Resp 16 | Ht 70.0 in | Wt 185.8 lb

## 2023-05-11 DIAGNOSIS — M19072 Primary osteoarthritis, left ankle and foot: Secondary | ICD-10-CM

## 2023-05-11 DIAGNOSIS — M19042 Primary osteoarthritis, left hand: Secondary | ICD-10-CM

## 2023-05-11 DIAGNOSIS — Z8249 Family history of ischemic heart disease and other diseases of the circulatory system: Secondary | ICD-10-CM

## 2023-05-11 DIAGNOSIS — Z111 Encounter for screening for respiratory tuberculosis: Secondary | ICD-10-CM | POA: Diagnosis not present

## 2023-05-11 DIAGNOSIS — Z79899 Other long term (current) drug therapy: Secondary | ICD-10-CM | POA: Diagnosis not present

## 2023-05-11 DIAGNOSIS — M0579 Rheumatoid arthritis with rheumatoid factor of multiple sites without organ or systems involvement: Secondary | ICD-10-CM | POA: Diagnosis not present

## 2023-05-11 DIAGNOSIS — M19071 Primary osteoarthritis, right ankle and foot: Secondary | ICD-10-CM

## 2023-05-11 DIAGNOSIS — M19041 Primary osteoarthritis, right hand: Secondary | ICD-10-CM

## 2023-05-11 NOTE — Progress Notes (Signed)
CBC WNL

## 2023-05-11 NOTE — Patient Instructions (Signed)
Standing Labs We placed an order today for your standing lab work.   Please have your standing labs drawn in October and every 3 months   Please have your labs drawn 2 weeks prior to your appointment so that the provider can discuss your lab results at your appointment, if possible.  Please note that you may see your imaging and lab results in MyChart before we have reviewed them. We will contact you once all results are reviewed. Please allow our office up to 72 hours to thoroughly review all of the results before contacting the office for clarification of your results.  WALK-IN LAB HOURS  Monday through Thursday from 8:00 am -12:30 pm and 1:00 pm-5:00 pm and Friday from 8:00 am-12:00 pm.  Patients with office visits requiring labs will be seen before walk-in labs.  You may encounter longer than normal wait times. Please allow additional time. Wait times may be shorter on  Monday and Thursday afternoons.  We do not book appointments for walk-in labs. We appreciate your patience and understanding with our staff.   Labs are drawn by [facility]. Please bring your co-pay at the time of your lab draw.  You may receive a bill from [facility] for your lab work.  Please note if you are on Hydroxychloroquine and and an order has been placed for a Hydroxychloroquine level,  you will need to have it drawn 4 hours or more after your last dose.  If you wish to have your labs drawn at another location, please call the office 24 hours in advance so we can fax the orders.  The office is located at [address], [address], [place], [place] [address]   If you have any questions regarding directions or hours of operation,  please call [phone].   As a reminder, please drink plenty of water prior to coming for your lab work. Thanks!  

## 2023-05-12 NOTE — Progress Notes (Signed)
CMP WNL

## 2023-05-13 LAB — COMPLETE METABOLIC PANEL WITH GFR
AG Ratio: 1.5 (calc) (ref 1.0–2.5)
ALT: 25 U/L (ref 9–46)
AST: 23 U/L (ref 10–35)
Albumin: 4.4 g/dL (ref 3.6–5.1)
Alkaline phosphatase (APISO): 60 U/L (ref 35–144)
BUN: 21 mg/dL (ref 7–25)
CO2: 27 mmol/L (ref 20–32)
Calcium: 9.4 mg/dL (ref 8.6–10.3)
Chloride: 106 mmol/L (ref 98–110)
Creat: 1.02 mg/dL (ref 0.70–1.30)
Globulin: 3 g/dL (calc) (ref 1.9–3.7)
Glucose, Bld: 87 mg/dL (ref 65–99)
Potassium: 4.2 mmol/L (ref 3.5–5.3)
Sodium: 138 mmol/L (ref 135–146)
Total Bilirubin: 1 mg/dL (ref 0.2–1.2)
Total Protein: 7.4 g/dL (ref 6.1–8.1)
eGFR: 86 mL/min/{1.73_m2} (ref >=60)

## 2023-05-13 LAB — QUANTIFERON-TB GOLD PLUS
Mitogen-NIL: >10 IU/mL
NIL: 0.04 IU/mL
QuantiFERON-TB Gold Plus: NEGATIVE
TB1-NIL: 0.01 IU/mL
TB2-NIL: 0.03 IU/mL

## 2023-05-13 LAB — CBC WITH DIFFERENTIAL/PLATELET
Absolute Monocytes: 700 cells/uL (ref 200–950)
Basophils Absolute: 63 cells/uL (ref 0–200)
Basophils Relative: 0.9 %
Eosinophils Absolute: 196 cells/uL (ref 15–500)
Eosinophils Relative: 2.8 %
HCT: 45.9 % (ref 38.5–50.0)
Hemoglobin: 15.3 g/dL (ref 13.2–17.1)
Lymphs Abs: 2597 cells/uL (ref 850–3900)
MCH: 30.2 pg (ref 27.0–33.0)
MCHC: 33.3 g/dL (ref 32.0–36.0)
MCV: 90.7 fL (ref 80.0–100.0)
MPV: 12.4 fL (ref 7.5–12.5)
Monocytes Relative: 10 %
Neutro Abs: 3444 cells/uL (ref 1500–7800)
Neutrophils Relative %: 49.2 %
Platelets: 209 10*3/uL (ref 140–400)
RBC: 5.06 10*6/uL (ref 4.20–5.80)
RDW: 13.1 % (ref 11.0–15.0)
Total Lymphocyte: 37.1 %
WBC: 7 10*3/uL (ref 3.8–10.8)

## 2023-05-14 NOTE — Progress Notes (Signed)
TB gold negative

## 2023-05-22 ENCOUNTER — Other Ambulatory Visit: Payer: Self-pay

## 2023-05-22 ENCOUNTER — Other Ambulatory Visit (HOSPITAL_COMMUNITY): Payer: Self-pay

## 2023-05-22 ENCOUNTER — Other Ambulatory Visit: Payer: Self-pay | Admitting: Rheumatology

## 2023-05-22 DIAGNOSIS — M0579 Rheumatoid arthritis with rheumatoid factor of multiple sites without organ or systems involvement: Secondary | ICD-10-CM

## 2023-05-22 DIAGNOSIS — Z79899 Other long term (current) drug therapy: Secondary | ICD-10-CM

## 2023-05-22 MED ORDER — HUMIRA (2 PEN) 40 MG/0.4ML ~~LOC~~ AJKT
40.0000 mg | AUTO-INJECTOR | SUBCUTANEOUS | 2 refills | Status: DC
  Filled 2023-05-22: qty 2, 28d supply, fill #0
  Filled 2023-06-24: qty 2, 28d supply, fill #1
  Filled 2023-07-27: qty 2, 28d supply, fill #2

## 2023-05-22 NOTE — Telephone Encounter (Signed)
Last Fill: 03/24/2023  Labs: 05/11/2023 CMP WNL CBC WNL   TB Gold: 05/11/2023 Neg    Next Visit: 10/12/2023  Last Visit: 05/11/2023  ZO:[id number] arthritis involving multiple sites with positive rheumatoid factor   Current Dose per office note 05/11/2023: [name] 40 mg sq injections every 14 days   Okay to refill [name]?

## 2023-05-22 NOTE — Telephone Encounter (Signed)
Last Fill: 03/24/2023  Labs: 05/11/2023 CMP WNL CBC WNL   TB Gold: 05/11/2023  TB gold negative   Next Visit: 10/12/2023  Last Visit: 05/11/2023  JX:[id number] arthritis involving multiple sites with positive rheumatoid factor   Current Dose per office note 05/11/2023:  [name] 40 mg sq injections every 14 days   Okay to refill [name]?

## 2023-05-26 ENCOUNTER — Other Ambulatory Visit (HOSPITAL_COMMUNITY): Payer: Self-pay

## 2023-06-24 ENCOUNTER — Other Ambulatory Visit (HOSPITAL_COMMUNITY): Payer: Self-pay

## 2023-06-25 ENCOUNTER — Other Ambulatory Visit (HOSPITAL_BASED_OUTPATIENT_CLINIC_OR_DEPARTMENT_OTHER): Payer: Self-pay

## 2023-06-26 ENCOUNTER — Other Ambulatory Visit (HOSPITAL_COMMUNITY): Payer: Self-pay

## 2023-07-17 ENCOUNTER — Other Ambulatory Visit: Payer: Self-pay | Admitting: Physician Assistant

## 2023-07-17 NOTE — Telephone Encounter (Signed)
Last Fill: 04/21/2023  Labs: 05/11/2023 CMP WNL, CBC WNL   Next Visit: 10/12/2023  Last Visit: 05/11/2023  DX: Rheumatoid arthritis involving multiple sites with positive rheumatoid factor   Current Dose per office note on 05/11/2023: methotrexate 7 tablets by mouth once weekly  Okay to refill Methotrexate?

## 2023-07-20 ENCOUNTER — Other Ambulatory Visit (HOSPITAL_COMMUNITY): Payer: Self-pay

## 2023-07-22 ENCOUNTER — Other Ambulatory Visit (HOSPITAL_COMMUNITY): Payer: Self-pay

## 2023-07-24 ENCOUNTER — Other Ambulatory Visit (HOSPITAL_COMMUNITY): Payer: Self-pay

## 2023-07-27 ENCOUNTER — Other Ambulatory Visit (HOSPITAL_COMMUNITY): Payer: Self-pay

## 2023-08-28 ENCOUNTER — Other Ambulatory Visit: Payer: Self-pay

## 2023-08-28 ENCOUNTER — Other Ambulatory Visit: Payer: Self-pay | Admitting: Physician Assistant

## 2023-08-28 ENCOUNTER — Other Ambulatory Visit (HOSPITAL_COMMUNITY): Payer: Self-pay

## 2023-08-28 DIAGNOSIS — M0579 Rheumatoid arthritis with rheumatoid factor of multiple sites without organ or systems involvement: Secondary | ICD-10-CM

## 2023-08-28 DIAGNOSIS — Z79899 Other long term (current) drug therapy: Secondary | ICD-10-CM

## 2023-09-15 ENCOUNTER — Other Ambulatory Visit (HOSPITAL_COMMUNITY): Payer: Self-pay

## 2023-09-17 ENCOUNTER — Other Ambulatory Visit: Payer: Self-pay

## 2023-09-17 ENCOUNTER — Other Ambulatory Visit: Payer: Self-pay | Admitting: Physician Assistant

## 2023-09-17 ENCOUNTER — Other Ambulatory Visit (HOSPITAL_COMMUNITY): Payer: Self-pay

## 2023-09-17 DIAGNOSIS — M0579 Rheumatoid arthritis with rheumatoid factor of multiple sites without organ or systems involvement: Secondary | ICD-10-CM

## 2023-09-17 DIAGNOSIS — Z79899 Other long term (current) drug therapy: Secondary | ICD-10-CM

## 2023-09-17 MED ORDER — HUMIRA (2 PEN) 40 MG/0.4ML ~~LOC~~ AJKT
40.0000 mg | AUTO-INJECTOR | SUBCUTANEOUS | 0 refills | Status: DC
  Filled 2023-09-17: qty 2, 28d supply, fill #0

## 2023-09-17 NOTE — Telephone Encounter (Signed)
Last Fill: 05/22/2023  Labs: 05/11/2023 CBC WNL CMP WNL   TB Gold: 05/11/2023 Neg    Next Visit: 10/12/2023  Last Visit: 05/10/2026  NG:[id number] arthritis involving multiple sites with positive rheumatoid factor   Current Dose per office note 05/11/2023: [name] 40 mg sq injections every 14 days   Left message to advise patient he is due to update labs.   Okay to refill [name]?

## 2023-09-17 NOTE — Progress Notes (Signed)
Specialty Pharmacy Refill Coordination Note  Ricky Hardin is a 57 y.o. male contacted today regarding refills of specialty medication(s) Adalimumab   Patient requested Delivery   Delivery date: 09/25/23   Verified address: [address]   [place] [address]   Medication will be filled on 09/24/23.

## 2023-09-17 NOTE — Progress Notes (Signed)
Specialty Pharmacy Ongoing Clinical Assessment Note  Ricky Hardin is a 57 y.o. male who is being followed by the specialty pharmacy service for RxSp Rheumatoid Arthritis   Patient's specialty medication(s) reviewed today: Adalimumab   Missed doses in the last 4 weeks: 0   Patient/Caregiver did not have any additional questions or concerns.   Therapeutic benefit summary: Patient is achieving benefit   Adverse events/side effects summary: No adverse events/side effects   Patient's therapy is appropriate to: Continue    Goals Addressed             This Visit's Progress    Reduce signs and symptoms       Patient is on track. Patient will maintain adherence         Follow up:  6 months  [name] Specialty Pharmacist

## 2023-09-28 NOTE — Progress Notes (Unsigned)
Office Visit Note  Patient: Ricky Hardin             Date of Birth: 10-Oct-1966           MRN: [id number]             PCP: [facility] Referring: [facility] Visit Date: 10/12/2023 Occupation: @GUAROCC @  Subjective:  Medication monitoring   History of Present Illness: Ricky Hardin is a 57 y.o. male with history of seropositive rheumatoid arthritis and osteoarthritis.  Patient remains on  [name] 40 mg sq injections every 14 days, methotrexate 7 tablets by mouth once weekly, and folic acid 2 mg daily.  He is tolerating combination therapy without any side effects and has not had any interruptions in therapy recently.  He denies any recent rheumatoid arthritis flares.  His morning stiffness has been lasting for about 10 minutes daily.  He has not been experiencing any nocturnal pain or difficulty with ADLs. He denies any new medical conditions.  He has not had any recent or recurrent infections.  Activities of Daily Living:  Patient reports morning stiffness for 10 minutes.   Patient Denies nocturnal pain.  Difficulty dressing/grooming: Denies Difficulty climbing stairs: Denies Difficulty getting out of chair: Denies Difficulty using hands for taps, buttons, cutlery, and/or writing: Denies  Review of Systems  Constitutional:  Negative for fatigue.  HENT:  Negative for mouth sores and mouth dryness.   Eyes:  Negative for pain, visual disturbance and dryness.  Respiratory:  Negative for shortness of breath.   Cardiovascular:  Negative for chest pain and palpitations.  Gastrointestinal:  Negative for blood in stool, constipation and diarrhea.  Endocrine: Negative for increased urination.  Genitourinary:  Negative for involuntary urination.  Musculoskeletal:  Positive for muscle weakness and morning stiffness. Negative for joint pain, gait problem, joint pain, joint swelling, myalgias, muscle tenderness and myalgias.  Skin:  Negative for color change,  rash, hair loss and sensitivity to sunlight.  Allergic/Immunologic: Negative for susceptible to infections.  Neurological:  Negative for dizziness and headaches.  Hematological:  Negative for swollen glands.  Psychiatric/Behavioral:  Negative for depressed mood and sleep disturbance. The patient is not nervous/anxious.     PMFS History:  Patient Active Problem List   Diagnosis Date Noted   RA (rheumatoid arthritis) (HCC) 09/19/2016   Osteoarthritis of both hands 09/19/2016   Osteoarthritis of both feet 09/19/2016    Past Medical History:  Diagnosis Date   Osteoarthritis of both feet 09/19/2016   Osteoarthritis of both hands 09/19/2016   RA (rheumatoid arthritis) (HCC) 09/19/2016   +RF, +CCP Erosive disease    Family History  Problem Relation Age of Onset   Colon polyps Mother    Heart disease Father 74   Rheum arthritis Sister    Colon cancer Neg Hx    Esophageal cancer Neg Hx    Stomach cancer Neg Hx    Rectal cancer Neg Hx    Past Surgical History:  Procedure Laterality Date   COLONOSCOPY  06/2020   Social History   Social History Narrative   Not on file    There is no immunization history on file for this patient.   Objective: Vital Signs: BP (!) 137/91 (BP Location: Left Arm, Patient Position: Sitting, Cuff Size: Normal)   Pulse (!) 54   Resp 16   Ht 5\' 10"  (1.778 m)   Wt 188 lb 6.4 oz (85.5 kg)   BMI 27.03 kg/m  Physical Exam Vitals and nursing note reviewed.  Constitutional:      Appearance: He is well-developed.  HENT:     Head: Normocephalic and atraumatic.  Eyes:     Conjunctiva/sclera: Conjunctivae normal.     Pupils: Pupils are equal, round, and reactive to light.  Cardiovascular:     Rate and Rhythm: Normal rate and regular rhythm.     Heart sounds: Normal heart sounds.  Pulmonary:     Effort: Pulmonary effort is normal.     Breath sounds: Normal breath sounds.  Abdominal:     General: Bowel sounds are normal.     Palpations: Abdomen  is soft.  Musculoskeletal:     Cervical back: Normal range of motion and neck supple.  Skin:    General: Skin is warm and dry.     Capillary Refill: Capillary refill takes less than 2 seconds.  Neurological:     Mental Status: He is alert and oriented to person, place, and time.  Psychiatric:        Behavior: Behavior normal.      Musculoskeletal Exam: C-spine, thoracic spine, lumbar spine have good range of motion.  Shoulder joints and elbow joints have good range of motion.  Limited range of motion of the left wrist.  PIP and DIP thickening consistent with osteoarthritis of both hands.  No tenderness or synovitis over MCP joints.  Complete fist formation bilaterally.  Hip joints have good range of motion with no groin pain.  Knee joints have good range of motion no warmth or effusion.  Ankle joints have good range of motion with no tenderness or synovitis.  CDAI Exam: CDAI Score: -- Patient Global: --; Provider Global: -- Swollen: --; Tender: -- Joint Exam 10/12/2023   No joint exam has been documented for this visit   There is currently no information documented on the homunculus. Go to the Rheumatology activity and complete the homunculus joint exam.  Investigation: No additional findings.  Imaging: No results found.  Recent Labs: Lab Results  Component Value Date   WBC 7.0 05/11/2023   HGB 15.3 05/11/2023   PLT 209 05/11/2023   NA 138 05/11/2023   K 4.2 05/11/2023   CL 106 05/11/2023   CO2 27 05/11/2023   GLUCOSE 87 05/11/2023   BUN 21 05/11/2023   CREATININE 1.02 05/11/2023   BILITOT 1.0 05/11/2023   ALKPHOS 83 05/05/2017   AST 23 05/11/2023   ALT 25 05/11/2023   PROT 7.4 05/11/2023   ALBUMIN 4.3 05/05/2017   CALCIUM 9.4 05/11/2023   GFRAA 95 02/12/2021   QFTBGOLDPLUS NEGATIVE 05/11/2023    Speciality Comments: [name] 10/19-01/24, [name] 01/24-  Procedures:  No procedures performed Allergies: Patient has no known allergies.   Assessment / Plan:      Visit Diagnoses: Rheumatoid arthritis involving multiple sites with positive rheumatoid factor (HCC): He has no synovitis on examination today.  He has not had any signs or symptoms of a rheumatoid arthritis flare.  He has clinically been doing well on [name] 40 mg sq injections every 14 days and methotrexate 7 tablets by mouth once weekly.  He is tolerating combination therapy without any side effects or recurrent infections.  His morning stiffness has been lasting for about 10 minutes daily.  He has occasional discomfort in both feet.  He remains active performing household activities.  No medication changes will be made at this time.  A refill of methotrexate was sent to the pharmacy today. He was advised to notify  us if he develops signs or symptoms of a flare. He will follow up in 5 months or sooner if needed.    High risk medication use - [name] 40 mg sq injections every 14 days, methotrexate 7 tablets by mouth once weekly, and folic acid 2 mg daily.  CBC and CMP WNL on 05/11/23. Orders for CBC and CMP released today.  His next lab work will be due in March and every 3 months.  Standing orders for CBC and CMP will be placed today. TB gold negative on 05/11/23.  No recent or recurrent infections.  Discussed the importance of holding humira and methotrexate if he develops signs or symptoms of an infection and to resume once the infection has completely cleared.   - Plan: CBC with Differential/Platelet, COMPLETE METABOLIC PANEL WITH GFR Encouraged the patient to schedule a dermatology visit for an updated yearly skin examination.  Primary osteoarthritis of both hands: He has PIP and DIP thickening consistent with osteoarthritis of both hands.  No active inflammation noted.  Discussed the importance of joint protection and muscle strengthening.  Primary osteoarthritis of both feet: He experiences intermittent discomfort and stiffness in both feet.  Both ankle joints have good ROM with no tenderness or  joint swelling.    Family history of heart disease  Orders: Orders Placed This Encounter  Procedures   CBC with Differential/Platelet   COMPLETE METABOLIC PANEL WITH GFR   CBC with Differential/Platelet   COMPLETE METABOLIC PANEL WITH GFR   Meds ordered this encounter  Medications   methotrexate (RHEUMATREX) 2.5 MG tablet    Sig: Take 7 tablets (17.5 mg total) by mouth once a week. Caution:Chemotherapy. Protect from light.    Dispense:  84 tablet    Refill:  0    Follow-Up Instructions: Return in about 5 months (around 03/11/2024) for Rheumatoid arthritis.   Gearldine Bienenstock, PA-C  Note - This record has been created using Dragon software.  Chart creation errors have been sought, but may not always  have been located. Such creation errors do not reflect on  the standard of medical care.

## 2023-10-01 ENCOUNTER — Other Ambulatory Visit: Payer: Self-pay

## 2023-10-12 ENCOUNTER — Encounter: Payer: Self-pay | Admitting: Physician Assistant

## 2023-10-12 ENCOUNTER — Ambulatory Visit: Payer: BC Managed Care – PPO | Attending: Physician Assistant | Admitting: Physician Assistant

## 2023-10-12 VITALS — BP 137/91 | HR 54 | Resp 16 | Ht 70.0 in | Wt 188.4 lb

## 2023-10-12 DIAGNOSIS — M19041 Primary osteoarthritis, right hand: Secondary | ICD-10-CM

## 2023-10-12 DIAGNOSIS — Z8249 Family history of ischemic heart disease and other diseases of the circulatory system: Secondary | ICD-10-CM

## 2023-10-12 DIAGNOSIS — M0579 Rheumatoid arthritis with rheumatoid factor of multiple sites without organ or systems involvement: Secondary | ICD-10-CM | POA: Diagnosis not present

## 2023-10-12 DIAGNOSIS — M19071 Primary osteoarthritis, right ankle and foot: Secondary | ICD-10-CM | POA: Diagnosis not present

## 2023-10-12 DIAGNOSIS — Z79899 Other long term (current) drug therapy: Secondary | ICD-10-CM | POA: Diagnosis not present

## 2023-10-12 DIAGNOSIS — M19072 Primary osteoarthritis, left ankle and foot: Secondary | ICD-10-CM

## 2023-10-12 DIAGNOSIS — M19042 Primary osteoarthritis, left hand: Secondary | ICD-10-CM

## 2023-10-12 MED ORDER — METHOTREXATE SODIUM 2.5 MG PO TABS: 17.5000 mg | ORAL_TABLET | ORAL | 0 refills | Status: DC

## 2023-10-12 NOTE — Progress Notes (Signed)
CBC WNL. CMP pending.

## 2023-10-12 NOTE — Patient Instructions (Signed)

## 2023-10-13 LAB — CBC WITH DIFFERENTIAL/PLATELET
Absolute Lymphocytes: 2408 {cells}/uL (ref 850–3900)
Absolute Monocytes: 609 {cells}/uL (ref 200–950)
Basophils Absolute: 63 {cells}/uL (ref 0–200)
Basophils Relative: 0.9 %
Eosinophils Absolute: 91 {cells}/uL (ref 15–500)
Eosinophils Relative: 1.3 %
HCT: 47.2 % (ref 38.5–50.0)
Hemoglobin: 15.4 g/dL (ref 13.2–17.1)
MCH: 29.4 pg (ref 27.0–33.0)
MCHC: 32.6 g/dL (ref 32.0–36.0)
MCV: 90.1 fL (ref 80.0–100.0)
MPV: 12.8 fL — ABNORMAL HIGH (ref 7.5–12.5)
Monocytes Relative: 8.7 %
Neutro Abs: 3829 {cells}/uL (ref 1500–7800)
Neutrophils Relative %: 54.7 %
Platelets: 240 10*3/uL (ref 140–400)
RBC: 5.24 10*6/uL (ref 4.20–5.80)
RDW: 12.6 % (ref 11.0–15.0)
Total Lymphocyte: 34.4 %
WBC: 7 10*3/uL (ref 3.8–10.8)

## 2023-10-13 LAB — COMPLETE METABOLIC PANEL WITH GFR
AG Ratio: 1.4 (calc) (ref 1.0–2.5)
ALT: 19 U/L (ref 9–46)
AST: 21 U/L (ref 10–35)
Albumin: 4.5 g/dL (ref 3.6–5.1)
Alkaline phosphatase (APISO): 61 U/L (ref 35–144)
BUN: 19 mg/dL (ref 7–25)
CO2: 26 mmol/L (ref 20–32)
Calcium: 9.5 mg/dL (ref 8.6–10.3)
Chloride: 104 mmol/L (ref 98–110)
Creat: 0.99 mg/dL (ref 0.70–1.30)
Globulin: 3.2 g/dL (ref 1.9–3.7)
Glucose, Bld: 97 mg/dL (ref 65–99)
Potassium: 4.2 mmol/L (ref 3.5–5.3)
Sodium: 138 mmol/L (ref 135–146)
Total Bilirubin: 1.2 mg/dL (ref 0.2–1.2)
Total Protein: 7.7 g/dL (ref 6.1–8.1)
eGFR: 88 mL/min/{1.73_m2} (ref >=60)

## 2023-10-13 NOTE — Progress Notes (Signed)
CMP WNL

## 2023-10-14 ENCOUNTER — Other Ambulatory Visit: Payer: Self-pay

## 2023-10-16 ENCOUNTER — Other Ambulatory Visit: Payer: Self-pay

## 2023-10-22 ENCOUNTER — Other Ambulatory Visit: Payer: Self-pay | Admitting: Rheumatology

## 2023-10-22 ENCOUNTER — Other Ambulatory Visit: Payer: Self-pay

## 2023-10-22 DIAGNOSIS — M0579 Rheumatoid arthritis with rheumatoid factor of multiple sites without organ or systems involvement: Secondary | ICD-10-CM

## 2023-10-22 DIAGNOSIS — Z79899 Other long term (current) drug therapy: Secondary | ICD-10-CM

## 2023-10-22 MED ORDER — HUMIRA (2 PEN) 40 MG/0.4ML ~~LOC~~ AJKT
40.0000 mg | AUTO-INJECTOR | SUBCUTANEOUS | 2 refills | Status: DC
  Filled 2023-10-22: qty 2, 28d supply, fill #0
  Filled 2023-11-19: qty 2, 28d supply, fill #1
  Filled 2023-12-30 – 2024-01-22 (×4): qty 2, 28d supply, fill #2

## 2023-10-22 NOTE — Telephone Encounter (Signed)
Last Fill: 09/17/2023 (30 day supply)  Labs: 10/12/2023 CBC WNL. CMP WNL   TB Gold: 05/11/2023 Neg    Next Visit: 03/21/2024  Last Visit: 10/12/2023  ZO:[id number] arthritis involving multiple sites with positive rheumatoid factor   Current Dose per office note 10/12/2023: [name] 40 mg sq injections every 14 days   Okay to refill [name]?

## 2023-10-22 NOTE — Progress Notes (Signed)
 Refill Request Completed.

## 2023-10-22 NOTE — Progress Notes (Signed)
Specialty Pharmacy Refill Coordination Note  Ricky Hardin is a 57 y.o. male contacted today regarding refills of specialty medication(s) Adalimumab ([name] (2 Pen))   Patient requested Delivery   Delivery date: 10/30/23   Verified address: [address]   [place] [address]   Medication will be filled on 10/29/23.

## 2023-10-28 ENCOUNTER — Other Ambulatory Visit: Payer: Self-pay | Admitting: Physician Assistant

## 2023-10-29 ENCOUNTER — Other Ambulatory Visit: Payer: Self-pay

## 2023-10-29 MED ORDER — FOLIC ACID 1 MG PO TABS: 2.0000 mg | ORAL_TABLET | Freq: Every day | ORAL | 3 refills | Status: DC

## 2023-10-29 NOTE — Telephone Encounter (Signed)
Last Fill: 11/06/2022  Next Visit: 03/21/2024  Last Visit: 10/12/2023  Dx: Rheumatoid arthritis involving multiple sites with positive rheumatoid factor   Current Dose per office note on 10/12/2023:  folic acid 2 mg daily.   Okay to refill Folic Acid?

## 2023-10-31 DIAGNOSIS — R109 Unspecified abdominal pain: Secondary | ICD-10-CM | POA: Diagnosis not present

## 2023-10-31 DIAGNOSIS — R300 Dysuria: Secondary | ICD-10-CM | POA: Diagnosis not present

## 2023-10-31 DIAGNOSIS — R319 Hematuria, unspecified: Secondary | ICD-10-CM | POA: Diagnosis not present

## 2023-10-31 DIAGNOSIS — R1031 Right lower quadrant pain: Secondary | ICD-10-CM | POA: Diagnosis not present

## 2023-11-19 ENCOUNTER — Other Ambulatory Visit: Payer: Self-pay

## 2023-11-19 NOTE — Progress Notes (Signed)
 Specialty Pharmacy Refill Coordination Note  Ricky Hardin is a 57 y.o. male contacted today regarding refills of specialty medication(s) Adalimumab  ([name]  (2 Pen))   Patient requested Delivery   Delivery date: 11/27/23   Verified address: [address]   [place] [address]   Medication will be filled on 01.16.25.

## 2023-11-26 ENCOUNTER — Other Ambulatory Visit: Payer: Self-pay

## 2023-12-30 ENCOUNTER — Other Ambulatory Visit: Payer: Self-pay

## 2023-12-30 NOTE — Progress Notes (Signed)
 Specialty Pharmacy Refill Coordination Note  Ricky Hardin is a 58 y.o. male contacted today regarding refills of specialty medication(s) Adalimumab ([name] (2 Pen))   Patient requested Delivery   Delivery date: 01/08/24   Verified address: [address]   [place] [id number]   Medication will be filled on 02.27.25.

## 2024-01-07 ENCOUNTER — Other Ambulatory Visit: Payer: Self-pay

## 2024-01-07 NOTE — Progress Notes (Signed)
 Patient was contacted today regarding specialty medication [name]. Coupon has been exhausted and current copay is $553.00. Patient to contact [employer] copay support for assistance.

## 2024-01-08 ENCOUNTER — Other Ambulatory Visit: Payer: Self-pay | Admitting: Physician Assistant

## 2024-01-08 NOTE — Telephone Encounter (Signed)
 Last Fill: 10/12/2023  Labs: 10/12/2023 CMP WNL CBC WNL   Next Visit: 03/22/2023  Last Visit: 10/12/2023  DX: Rheumatoid arthritis involving multiple sites with positive rheumatoid factor   Current Dose per office note 10/12/2023: methotrexate 7 tablets by mouth once weekly   Okay to refill Methotrexate?

## 2024-01-12 ENCOUNTER — Other Ambulatory Visit: Payer: Self-pay

## 2024-01-13 ENCOUNTER — Other Ambulatory Visit: Payer: Self-pay

## 2024-01-13 NOTE — Progress Notes (Signed)
 Patient was contacted again in regards to copayment card funding on 3/4. Profiling Rx for now.

## 2024-01-22 ENCOUNTER — Other Ambulatory Visit (HOSPITAL_COMMUNITY): Payer: Self-pay

## 2024-01-22 ENCOUNTER — Other Ambulatory Visit: Payer: Self-pay

## 2024-01-22 NOTE — Progress Notes (Signed)
 Specialty Pharmacy Refill Coordination Note  Ricky Hardin is a 58 y.o. male contacted today regarding refills of specialty medication(s) Adalimumab ([name] (2 Pen))   Patient requested Delivery   Delivery date: 01/26/24   Verified address: [address]   [place] [address]   Medication will be filled on 01/25/24.

## 2024-01-25 ENCOUNTER — Other Ambulatory Visit: Payer: Self-pay

## 2024-02-15 ENCOUNTER — Other Ambulatory Visit: Payer: Self-pay

## 2024-02-15 ENCOUNTER — Other Ambulatory Visit (HOSPITAL_COMMUNITY): Payer: Self-pay

## 2024-02-15 ENCOUNTER — Other Ambulatory Visit: Payer: Self-pay | Admitting: Physician Assistant

## 2024-02-15 DIAGNOSIS — M0579 Rheumatoid arthritis with rheumatoid factor of multiple sites without organ or systems involvement: Secondary | ICD-10-CM

## 2024-02-15 DIAGNOSIS — Z79899 Other long term (current) drug therapy: Secondary | ICD-10-CM

## 2024-02-15 MED ORDER — HUMIRA (2 PEN) 40 MG/0.4ML ~~LOC~~ AJKT
40.0000 mg | AUTO-INJECTOR | SUBCUTANEOUS | 2 refills | Status: DC
  Filled 2024-02-15: qty 2, 28d supply, fill #0
  Filled 2024-03-15: qty 2, 28d supply, fill #1
  Filled 2024-04-27: qty 2, 28d supply, fill #2

## 2024-02-15 NOTE — Telephone Encounter (Signed)
 Last Fill: 10/22/2023  Labs: 10/12/2023  CMP WNL  CBC WNL   TB Gold: 05/11/2023   TB gold negative   Next Visit: 03/21/2024  Last Visit: 10/12/2023  NF:[id number] arthritis involving multiple sites with positive rheumatoid factor   Current Dose per office note 10/12/2023: [name] 40 mg sq injections every 14 days   Okay to refill [name]?

## 2024-02-15 NOTE — Progress Notes (Signed)
 Specialty Pharmacy Refill Coordination Note  Ricky Hardin is a 58 y.o. male contacted today regarding refills of specialty medication(s) Adalimumab ([name] (2 Pen))   Patient requested Delivery   Delivery date: 02/23/24   Verified address: [address]   [place] [address]   Medication will be filled on 02/22/24.   This fill date is pending response to refill request from provider. Patient is aware and if they have not received fill by intended date, they must follow up with pharmacy.

## 2024-02-22 ENCOUNTER — Other Ambulatory Visit: Payer: Self-pay

## 2024-03-03 ENCOUNTER — Other Ambulatory Visit: Payer: Self-pay

## 2024-03-06 ENCOUNTER — Telehealth: Payer: Self-pay | Admitting: Pharmacist

## 2024-03-06 NOTE — Telephone Encounter (Signed)
 Submitted a Prior Authorization RENEWAL request to [facility] for [name]  via CoverMyMeds. Will update once we receive a response.  Key: [id number]

## 2024-03-07 NOTE — Progress Notes (Deleted)
 Office Visit Note  Patient: Ricky Hardin             Date of Birth: [birth year]           MRN: [id number]             [facility] Referring: [facility] Visit Date: 03/21/2024 Occupation: @GUAROCC @  Subjective:  No chief complaint on file.   History of Present Illness: Ricky Hardin is a 58 y.o. male ***     Activities of Daily Living:  Patient reports morning stiffness for *** {minute/hour:19697}.   Patient {ACTIONS;DENIES/REPORTS:21021675::"Denies"} nocturnal pain.  Difficulty dressing/grooming: {ACTIONS;DENIES/REPORTS:21021675::"Denies"} Difficulty climbing stairs: {ACTIONS;DENIES/REPORTS:21021675::"Denies"} Difficulty getting out of chair: {ACTIONS;DENIES/REPORTS:21021675::"Denies"} Difficulty using hands for taps, buttons, cutlery, and/or writing: {ACTIONS;DENIES/REPORTS:21021675::"Denies"}  No Rheumatology ROS completed.   PMFS History:  Patient Active Problem List   Diagnosis Date Noted   RA (rheumatoid arthritis) (HCC) 09/19/2016   Osteoarthritis of both hands 09/19/2016   Osteoarthritis of both feet 09/19/2016    Past Medical History:  Diagnosis Date   Osteoarthritis of both feet 09/19/2016   Osteoarthritis of both hands 09/19/2016   RA (rheumatoid arthritis) (HCC) 09/19/2016   +RF, +CCP Erosive disease    Family History  Problem Relation Age of Onset   Colon polyps Mother    Heart disease Father 47   Rheum arthritis Sister    Colon cancer Neg Hx    Esophageal cancer Neg Hx    Stomach cancer Neg Hx    Rectal cancer Neg Hx    Past Surgical History:  Procedure Laterality Date   COLONOSCOPY  06/2020   Social History   Social History Narrative   Not on file    There is no immunization history on file for this patient.   Objective: Vital Signs: There were no vitals taken for this visit.   Physical Exam   Musculoskeletal Exam: ***  CDAI Exam: CDAI Score: -- Patient Global: --; Provider Global:  -- Swollen: --; Tender: -- Joint Exam 03/21/2024   No joint exam has been documented for this visit   There is currently no information documented on the homunculus. Go to the Rheumatology activity and complete the homunculus joint exam.  Investigation: No additional findings.  Imaging: No results found.  Recent Labs: Lab Results  Component Value Date   WBC 7.0 10/12/2023   HGB 15.4 10/12/2023   PLT 240 10/12/2023   NA 138 10/12/2023   K 4.2 10/12/2023   CL 104 10/12/2023   CO2 26 10/12/2023   GLUCOSE 97 10/12/2023   BUN 19 10/12/2023   CREATININE 0.99 10/12/2023   BILITOT 1.2 10/12/2023   ALKPHOS 83 05/05/2017   AST 21 10/12/2023   ALT 19 10/12/2023   PROT 7.7 10/12/2023   ALBUMIN 4.3 05/05/2017   CALCIUM 9.5 10/12/2023   GFRAA 95 02/12/2021   QFTBGOLDPLUS NEGATIVE 05/11/2023    Speciality Comments: [name]  10/19-01/24, [name]  01/24-  Procedures:  No procedures performed Allergies: Patient has no known allergies.   Assessment / Plan:     Visit Diagnoses: No diagnosis found.  Orders: No orders of the defined types were placed in this encounter.  No orders of the defined types were placed in this encounter.   Face-to-face time spent with patient was *** minutes. Greater than 50% of time was spent in counseling and coordination of care.  Follow-Up Instructions: No follow-ups on file.   Dee Farber, CMA  Note - This record has been  created using [employer].  Chart creation errors have been sought, but may not always  have been located. Such creation errors do not reflect on  the standard of medical care.

## 2024-03-07 NOTE — Telephone Encounter (Signed)
 Received notification from [facility] regarding a prior authorization for [name] . Authorization has been APPROVED from 03/06/24 to 03/06/25.  Authorization # [id number]  Geraldene Kleine, PharmD, MPH, BCPS, CPP Clinical Pharmacist (Rheumatology and Pulmonology)

## 2024-03-15 ENCOUNTER — Other Ambulatory Visit: Payer: Self-pay

## 2024-03-15 NOTE — Progress Notes (Signed)
 Specialty Pharmacy Refill Coordination Note  Ricky Hardin is a 58 y.o. male contacted today regarding refills of specialty medication(s) Adalimumab  ([name]  (2 Pen))   Patient requested Delivery   Delivery date: 03/30/24   Verified address: [address]   [place] [address]   Medication will be filled on 03/29/24.

## 2024-03-15 NOTE — Progress Notes (Signed)
 Specialty Pharmacy Ongoing Clinical Assessment Note  Ricky Hardin is a 58 y.o. male who is being followed by the specialty pharmacy service for RxSp Rheumatoid Arthritis   Patient's specialty medication(s) reviewed today: Adalimumab  ([name]  (2 Pen))   Missed doses in the last 4 weeks: 0   Patient/Caregiver did not have any additional questions or concerns.   Therapeutic benefit summary: Patient is achieving benefit   Adverse events/side effects summary: No adverse events/side effects   Patient's therapy is appropriate to: Continue    Goals Addressed             This Visit's Progress    Reduce signs and symptoms   On track    Patient is on track. Patient will maintain adherence         Follow up:  6 months  Gerilyn Stargell M Necie Wilcoxson Specialty Pharmacist

## 2024-03-21 ENCOUNTER — Ambulatory Visit: Payer: BC Managed Care – PPO | Admitting: Rheumatology

## 2024-03-21 DIAGNOSIS — M0579 Rheumatoid arthritis with rheumatoid factor of multiple sites without organ or systems involvement: Secondary | ICD-10-CM

## 2024-03-21 DIAGNOSIS — Z79899 Other long term (current) drug therapy: Secondary | ICD-10-CM

## 2024-03-21 DIAGNOSIS — M19041 Primary osteoarthritis, right hand: Secondary | ICD-10-CM

## 2024-03-21 DIAGNOSIS — Z8249 Family history of ischemic heart disease and other diseases of the circulatory system: Secondary | ICD-10-CM

## 2024-03-21 DIAGNOSIS — M19071 Primary osteoarthritis, right ankle and foot: Secondary | ICD-10-CM

## 2024-03-21 NOTE — Progress Notes (Signed)
 Office Visit Note  Patient: Ricky Hardin             Date of Birth: Oct 11, 1966           MRN: [id number]             [facility]: [facility] Referring: [facility] Visit Date: 03/30/2024 Occupation: @GUAROCC @  Subjective:  Medication monitoring  History of Present Illness: Ricky Hardin is a 58 y.o. male with history of seropositive rheumatoid arthritis and osteoarthritis.  Patient remains on  [name]  40 mg sq injections every 14 days, methotrexate  7 tablets by mouth once weekly, and folic acid  2 mg daily.  He is tolerating combination therapy without any side effects and has not had any recent gaps in therapy.  He denies any signs or symptoms of a rheumatoid arthritis flare.  He remains active playing tennis 3 to 4 days/week.  He occasionally will have increased morning stiffness lasting for up to 1 hour daily.  He has not had any nocturnal pain or difficulty with ADLs.  He denies any joint swelling.  He denies any new medical conditions or new medications.  He denies any recent or recurrent infections.     Activities of Daily Living:  Patient reports morning stiffness for 1 hour.   Patient Denies nocturnal pain.  Difficulty dressing/grooming: Denies Difficulty climbing stairs: Denies Difficulty getting out of chair: Denies Difficulty using hands for taps, buttons, cutlery, and/or writing: Denies  Review of Systems  Constitutional:  Negative for fatigue.  HENT:  Negative for mouth sores and mouth dryness.   Eyes:  Negative for dryness.  Respiratory:  Negative for shortness of breath.   Cardiovascular:  Negative for chest pain and palpitations.  Gastrointestinal:  Negative for blood in stool, constipation and diarrhea.  Endocrine: Negative for increased urination.  Genitourinary:  Negative for involuntary urination.  Musculoskeletal:  Positive for joint pain, joint pain and morning stiffness. Negative for gait problem, joint swelling, myalgias, muscle  weakness, muscle tenderness and myalgias.  Skin:  Positive for sensitivity to sunlight. Negative for color change, rash and hair loss.  Allergic/Immunologic: Negative for susceptible to infections.  Neurological:  Negative for dizziness and headaches.  Hematological:  Negative for swollen glands.  Psychiatric/Behavioral:  Negative for depressed mood and sleep disturbance. The patient is not nervous/anxious.     PMFS History:  Patient Active Problem List   Diagnosis Date Noted   RA (rheumatoid arthritis) (HCC) 09/19/2016   Osteoarthritis of both hands 09/19/2016   Osteoarthritis of both feet 09/19/2016    Past Medical History:  Diagnosis Date   Osteoarthritis of both feet 09/19/2016   Osteoarthritis of both hands 09/19/2016   RA (rheumatoid arthritis) (HCC) 09/19/2016   +RF, +CCP Erosive disease    Family History  Problem Relation Age of Onset   Colon polyps Mother    Heart disease Father 36   Rheum arthritis Sister    Colon cancer Neg Hx    Esophageal cancer Neg Hx    Stomach cancer Neg Hx    Rectal cancer Neg Hx    Past Surgical History:  Procedure Laterality Date   COLONOSCOPY  06/2020   Social History   Social History Narrative   Not on file    There is no immunization history on file for this patient.   Objective: Vital Signs: BP 130/83 (BP Location: Left Arm, Patient Position: Sitting, Cuff Size: Normal)   Pulse 62   Resp 15  Ht 5\' 10"  (1.778 m)   Wt 190 lb 6.4 oz (86.4 kg)   BMI 27.32 kg/m    Physical Exam Vitals and nursing note reviewed.  Constitutional:      Appearance: He is well-developed.  HENT:     Head: Normocephalic and atraumatic.  Eyes:     Conjunctiva/sclera: Conjunctivae normal.     Pupils: Pupils are equal, round, and reactive to light.  Cardiovascular:     Rate and Rhythm: Normal rate and regular rhythm.     Heart sounds: Normal heart sounds.  Pulmonary:     Effort: Pulmonary effort is normal.     Breath sounds: Normal breath  sounds.  Abdominal:     General: Bowel sounds are normal.     Palpations: Abdomen is soft.  Musculoskeletal:     Cervical back: Normal range of motion and neck supple.  Skin:    General: Skin is warm and dry.     Capillary Refill: Capillary refill takes less than 2 seconds.  Neurological:     Mental Status: He is alert and oriented to person, place, and time.  Psychiatric:        Behavior: Behavior normal.      Musculoskeletal Exam: C-spine, thoracic spine, lumbar spine have good range of motion.  Shoulder joints, elbow joints, wrist joints, MCPs, PIPs, DIPs have good range of motion with no synovitis.  PIP and DIP thickening consistent with osteoarthritis of both hands.  Complete fist formation bilaterally.  Hip joints have good range of motion with no groin pain.  Knee joints have good range of motion no warmth or effusion.  Ankle joints have good range of motion with no tenderness or joint swelling.  CDAI Exam: CDAI Score: -- Patient Global: --; Provider Global: -- Swollen: --; Tender: -- Joint Exam 03/30/2024   No joint exam has been documented for this visit   There is currently no information documented on the homunculus. Go to the Rheumatology activity and complete the homunculus joint exam.  Investigation: No additional findings.  Imaging: No results found.  Recent Labs: Lab Results  Component Value Date   WBC 7.0 10/12/2023   HGB 15.4 10/12/2023   PLT 240 10/12/2023   NA 138 10/12/2023   K 4.2 10/12/2023   CL 104 10/12/2023   CO2 26 10/12/2023   GLUCOSE 97 10/12/2023   BUN 19 10/12/2023   CREATININE 0.99 10/12/2023   BILITOT 1.2 10/12/2023   ALKPHOS 83 05/05/2017   AST 21 10/12/2023   ALT 19 10/12/2023   PROT 7.7 10/12/2023   ALBUMIN 4.3 05/05/2017   CALCIUM 9.5 10/12/2023   GFRAA 95 02/12/2021   QFTBGOLDPLUS NEGATIVE 05/11/2023    Speciality Comments: [name]  10/19-01/24, [name]  01/24-  Procedures:  No procedures performed Allergies: Patient has  no known allergies.   Assessment / Plan:     Visit Diagnoses: Rheumatoid arthritis involving multiple sites with positive rheumatoid factor [facility]): He has no joint tenderness or synovitis on examination today.  He has not had any signs or symptoms of a rheumatoid arthritis flare.  He has clinically been doing well on [name]  40 mg subcutaneous injections every 14 days, methotrexate  7 tablets by mouth once weekly, and folic acid  2 mg daily.  He is tolerating combination therapy without any side effects and has not had any recent gaps in therapy.  No recent or recurrent infections.  He occasionally will experience morning stiffness lasting up to 1 hour daily.  He has not had any nocturnal  pain or difficulty performing ADLs.  He remains active playing tennis 3 to 4 days/week.  He will remain on [name]  and methotrexate  as combination therapy.  A refill of methotrexate  was sent to the pharmacy today.  He was advised to notify us  if he develops signs or symptoms of a flare.  He will follow-up in the office in 5 months or sooner if needed.  High risk medication use - [name]  40 mg sq injections every 14 days, methotrexate  7 tablets by mouth once weekly, and folic acid  2 mg daily.  CBC and CMP updated on 10/12/23.  Orders for CBC and CMP released today.  His next lab work will be due in August and every 3 months to monitor for drug toxicity.  Standing orders for CBC and CMP remain in place. TB gold negative on 05/11/23.  Future order for TB gold placed today. No recent or recurrent infections.  Discussed the importance of holding humira  and methotrexate  if he develops signs or symptoms of an infection and to resume once the infection has completley cleared.  Patient went for his yearly dermatology visit for skin cancer screening yesterday. - Plan: CBC with Differential/Platelet, Comprehensive metabolic panel with GFR, QuantiFERON-TB Gold Plus  Screening for tuberculosis -Future order for TB gold placed today.  Plan:  QuantiFERON-TB Gold Plus  Primary osteoarthritis of both hands: He has PIP and DIP thickening consistent with osteoarthritis of both hands.  No tenderness or synovitis noted.  Complete fist formation bilaterally.  Primary osteoarthritis of both feet: He is not experiencing any increased discomfort in his feet at this time.  He wears proper fitting shoes.  He has good range of motion of both ankle joints with no tenderness or joint swelling.  Family history of heart disease    Orders: Orders Placed This Encounter  Procedures   CBC with Differential/Platelet   Comprehensive metabolic panel with GFR   QuantiFERON-TB Gold Plus   Meds ordered this encounter  Medications   methotrexate  (RHEUMATREX) 2.5 MG tablet    Sig: Take 7 tablets (17.5 mg total) by mouth once a week. Caution:Chemotherapy. Protect from light.    Dispense:  84 tablet    Refill:  0    Follow-Up Instructions: Return in 5 months (on 08/30/2024) for Rheumatoid arthritis, Osteoarthritis.   Romayne Clubs, PA-C  Note - This record has been created using Dragon software.  Chart creation errors have been sought, but may not always  have been located. Such creation errors do not reflect on  the standard of medical care.

## 2024-03-28 DIAGNOSIS — Z1283 Encounter for screening for malignant neoplasm of skin: Secondary | ICD-10-CM | POA: Diagnosis not present

## 2024-03-28 DIAGNOSIS — L570 Actinic keratosis: Secondary | ICD-10-CM | POA: Diagnosis not present

## 2024-03-28 DIAGNOSIS — L821 Other seborrheic keratosis: Secondary | ICD-10-CM | POA: Diagnosis not present

## 2024-03-28 DIAGNOSIS — D225 Melanocytic nevi of trunk: Secondary | ICD-10-CM | POA: Diagnosis not present

## 2024-03-28 DIAGNOSIS — X32XXXA Exposure to sunlight, initial encounter: Secondary | ICD-10-CM | POA: Diagnosis not present

## 2024-03-29 ENCOUNTER — Other Ambulatory Visit: Payer: Self-pay

## 2024-03-30 ENCOUNTER — Encounter: Payer: Self-pay | Admitting: Physician Assistant

## 2024-03-30 ENCOUNTER — Ambulatory Visit: Attending: Physician Assistant | Admitting: Physician Assistant

## 2024-03-30 VITALS — BP 130/83 | HR 62 | Resp 15 | Ht 70.0 in | Wt 190.4 lb

## 2024-03-30 DIAGNOSIS — M0579 Rheumatoid arthritis with rheumatoid factor of multiple sites without organ or systems involvement: Secondary | ICD-10-CM

## 2024-03-30 DIAGNOSIS — M19072 Primary osteoarthritis, left ankle and foot: Secondary | ICD-10-CM

## 2024-03-30 DIAGNOSIS — Z79899 Other long term (current) drug therapy: Secondary | ICD-10-CM | POA: Diagnosis not present

## 2024-03-30 DIAGNOSIS — Z8249 Family history of ischemic heart disease and other diseases of the circulatory system: Secondary | ICD-10-CM

## 2024-03-30 DIAGNOSIS — M19042 Primary osteoarthritis, left hand: Secondary | ICD-10-CM

## 2024-03-30 DIAGNOSIS — M19071 Primary osteoarthritis, right ankle and foot: Secondary | ICD-10-CM | POA: Diagnosis not present

## 2024-03-30 DIAGNOSIS — M19041 Primary osteoarthritis, right hand: Secondary | ICD-10-CM | POA: Diagnosis not present

## 2024-03-30 DIAGNOSIS — Z111 Encounter for screening for respiratory tuberculosis: Secondary | ICD-10-CM

## 2024-03-30 MED ORDER — METHOTREXATE SODIUM 2.5 MG PO TABS: 17.5000 mg | ORAL_TABLET | ORAL | 0 refills | Status: DC

## 2024-03-30 NOTE — Patient Instructions (Signed)
 Standing Labs We placed an order today for your standing lab work.   Please have your standing labs drawn in August and every 3 months   Please have your labs drawn 2 weeks prior to your appointment so that the provider can discuss your lab results at your appointment, if possible.  Please note that you may see your imaging and lab results in MyChart before we have reviewed them. We will contact you once all results are reviewed. Please allow our office up to 72 hours to thoroughly review all of the results before contacting the office for clarification of your results.  WALK-IN LAB HOURS  Monday through Thursday from 8:00 am -12:30 pm and 1:00 pm-4:00 pm and Friday from 8:00 am-12:00 pm.  Patients with office visits requiring labs will be seen before walk-in labs.  You may encounter longer than normal wait times. Please allow additional time. Wait times may be shorter on  Monday and Thursday afternoons.  We do not book appointments for walk-in labs. We appreciate your patience and understanding with our staff.   Labs are drawn by [facility]. Please bring your co-pay at the time of your lab draw.  You may receive a bill from [facility] for your lab work.  Please note if you are on Hydroxychloroquine  and and an order has been placed for a Hydroxychloroquine  level,  you will need to have it drawn 4 hours or more after your last dose.  If you wish to have your labs drawn at another location, please call the office 24 hours in advance so we can fax the orders.  The office is located at [address], [address], [place], [place] [address]   If you have any questions regarding directions or hours of operation,  please call [phone].   As a reminder, please drink plenty of water prior to coming for your lab work. Thanks!

## 2024-03-31 ENCOUNTER — Ambulatory Visit: Payer: Self-pay | Admitting: Physician Assistant

## 2024-03-31 LAB — CBC WITH DIFFERENTIAL/PLATELET
Absolute Lymphocytes: 3100 {cells}/uL (ref 850–3900)
Absolute Monocytes: 832 {cells}/uL (ref 200–950)
Basophils Absolute: 50 {cells}/uL (ref 0–200)
Basophils Relative: 0.6 %
Eosinophils Absolute: 244 {cells}/uL (ref 15–500)
Eosinophils Relative: 2.9 %
HCT: 45.3 % (ref 38.5–50.0)
Hemoglobin: 15.1 g/dL (ref 13.2–17.1)
MCH: 30.3 pg (ref 27.0–33.0)
MCHC: 33.3 g/dL (ref 32.0–36.0)
MCV: 91 fL (ref 80.0–100.0)
MPV: 12.6 fL — ABNORMAL HIGH (ref 7.5–12.5)
Monocytes Relative: 9.9 %
Neutro Abs: 4175 {cells}/uL (ref 1500–7800)
Neutrophils Relative %: 49.7 %
Platelets: 206 10*3/uL (ref 140–400)
RBC: 4.98 10*6/uL (ref 4.20–5.80)
RDW: 12.9 % (ref 11.0–15.0)
Total Lymphocyte: 36.9 %
WBC: 8.4 10*3/uL (ref 3.8–10.8)

## 2024-03-31 LAB — COMPREHENSIVE METABOLIC PANEL WITH GFR
AG Ratio: 1.4 (calc) (ref 1.0–2.5)
ALT: 18 U/L (ref 9–46)
AST: 21 U/L (ref 10–35)
Albumin: 4.3 g/dL (ref 3.6–5.1)
Alkaline phosphatase (APISO): 61 U/L (ref 35–144)
BUN: 25 mg/dL (ref 7–25)
CO2: 25 mmol/L (ref 20–32)
Calcium: 9.4 mg/dL (ref 8.6–10.3)
Chloride: 106 mmol/L (ref 98–110)
Creat: 0.91 mg/dL (ref 0.70–1.30)
Globulin: 3 g/dL (ref 1.9–3.7)
Glucose, Bld: 83 mg/dL (ref 65–99)
Potassium: 4.1 mmol/L (ref 3.5–5.3)
Sodium: 139 mmol/L (ref 135–146)
Total Bilirubin: 0.9 mg/dL (ref 0.2–1.2)
Total Protein: 7.3 g/dL (ref 6.1–8.1)
eGFR: 98 mL/min/{1.73_m2} (ref >=60)

## 2024-03-31 NOTE — Progress Notes (Signed)
 CBC and CMP WNL

## 2024-04-27 ENCOUNTER — Other Ambulatory Visit: Payer: Self-pay | Admitting: Pharmacy Technician

## 2024-04-27 ENCOUNTER — Other Ambulatory Visit: Payer: Self-pay

## 2024-04-27 NOTE — Progress Notes (Signed)
 Specialty Pharmacy Refill Coordination Note  Ricky Hardin is a 58 y.o. male contacted today regarding refills of specialty medication(s) Adalimumab  ([name]  (2 Pen))   Patient requested Delivery   Delivery date: 05/11/24   Verified address: [address] [place], [place] [address]   Medication will be filled on 05/11/24.

## 2024-05-11 ENCOUNTER — Other Ambulatory Visit: Payer: Self-pay

## 2024-06-03 ENCOUNTER — Other Ambulatory Visit (HOSPITAL_COMMUNITY): Payer: Self-pay

## 2024-06-03 ENCOUNTER — Other Ambulatory Visit: Payer: Self-pay | Admitting: Physician Assistant

## 2024-06-03 ENCOUNTER — Other Ambulatory Visit: Payer: Self-pay

## 2024-06-03 DIAGNOSIS — M0579 Rheumatoid arthritis with rheumatoid factor of multiple sites without organ or systems involvement: Secondary | ICD-10-CM

## 2024-06-03 DIAGNOSIS — Z79899 Other long term (current) drug therapy: Secondary | ICD-10-CM

## 2024-06-03 NOTE — Telephone Encounter (Signed)
 Last Fill: 02/15/2024  Labs: 03/30/2024 CBC and CMP WNL   TB Gold: 05/10/2024 TB Gold Negative    LMOM for patient to update labs.   Next Visit: 09/20/2024  Last Visit: 03/30/2024  DX: Rheumatoid arthritis involving multiple sites with positive rheumatoid factor   Current Dose per office note 03/30/2024: [name]  40 mg sq injections every 14 days   Okay to refill [name] ?

## 2024-06-03 NOTE — Progress Notes (Addendum)
 Specialty Pharmacy Refill Coordination Note  Spoke with Ricky Hardin is a 58 y.o. male contacted today regarding refills of specialty medication(s) Adalimumab  ([name]  (2 Pen))  Doses on hand: 1 for 06/07/24  Injection date: 06/21/24   Patient requested: Delivery   Delivery date: 06/10/24   Verified address: [address] [place] [address]  Medication will be filled on 06/09/24.    This fill date is pending response to refill request from provider. Patient is aware and if they have not received fill by intended date, they must follow up with pharmacy.

## 2024-06-05 MED ORDER — HUMIRA (2 PEN) 40 MG/0.4ML ~~LOC~~ AJKT
40.0000 mg | AUTO-INJECTOR | SUBCUTANEOUS | 0 refills | Status: DC
  Filled 2024-06-06: qty 2, 28d supply, fill #0

## 2024-06-06 ENCOUNTER — Other Ambulatory Visit: Payer: Self-pay

## 2024-06-08 ENCOUNTER — Other Ambulatory Visit: Payer: Self-pay

## 2024-06-08 ENCOUNTER — Other Ambulatory Visit: Payer: Self-pay | Admitting: *Deleted

## 2024-06-08 DIAGNOSIS — Z79899 Other long term (current) drug therapy: Secondary | ICD-10-CM | POA: Diagnosis not present

## 2024-06-08 DIAGNOSIS — Z111 Encounter for screening for respiratory tuberculosis: Secondary | ICD-10-CM

## 2024-06-09 ENCOUNTER — Other Ambulatory Visit (HOSPITAL_COMMUNITY): Payer: Self-pay

## 2024-06-09 ENCOUNTER — Ambulatory Visit: Payer: Self-pay | Admitting: Physician Assistant

## 2024-06-09 ENCOUNTER — Other Ambulatory Visit: Payer: Self-pay

## 2024-06-12 LAB — CBC WITH DIFFERENTIAL/PLATELET
Absolute Lymphocytes: 2657 {cells}/uL (ref 850–3900)
Absolute Monocytes: 762 {cells}/uL (ref 200–950)
Basophils Absolute: 59 {cells}/uL (ref 0–200)
Basophils Relative: 0.8 %
Eosinophils Absolute: 244 {cells}/uL (ref 15–500)
Eosinophils Relative: 3.3 %
HCT: 43.3 % (ref 38.5–50.0)
Hemoglobin: 14.4 g/dL (ref 13.2–17.1)
MCH: 30.6 pg (ref 27.0–33.0)
MCHC: 33.3 g/dL (ref 32.0–36.0)
MCV: 91.9 fL (ref 80.0–100.0)
MPV: 12.3 fL (ref 7.5–12.5)
Monocytes Relative: 10.3 %
Neutro Abs: 3678 {cells}/uL (ref 1500–7800)
Neutrophils Relative %: 49.7 %
Platelets: 207 Thousand/uL (ref 140–400)
RBC: 4.71 Million/uL (ref 4.20–5.80)
RDW: 13.1 % (ref 11.0–15.0)
Total Lymphocyte: 35.9 %
WBC: 7.4 Thousand/uL (ref 3.8–10.8)

## 2024-06-12 LAB — COMPREHENSIVE METABOLIC PANEL WITH GFR
AG Ratio: 1.6 (calc) (ref 1.0–2.5)
ALT: 21 U/L (ref 9–46)
AST: 20 U/L (ref 10–35)
Albumin: 4.1 g/dL (ref 3.6–5.1)
Alkaline phosphatase (APISO): 63 U/L (ref 35–144)
BUN: 23 mg/dL (ref 7–25)
CO2: 25 mmol/L (ref 20–32)
Calcium: 9.2 mg/dL (ref 8.6–10.3)
Chloride: 108 mmol/L (ref 98–110)
Creat: 0.98 mg/dL (ref 0.70–1.30)
Globulin: 2.6 g/dL (ref 1.9–3.7)
Glucose, Bld: 97 mg/dL (ref 65–99)
Potassium: 4.2 mmol/L (ref 3.5–5.3)
Sodium: 140 mmol/L (ref 135–146)
Total Bilirubin: 0.7 mg/dL (ref 0.2–1.2)
Total Protein: 6.7 g/dL (ref 6.1–8.1)
eGFR: 89 mL/min/1.73m2 (ref >=60)

## 2024-06-12 LAB — QUANTIFERON-TB GOLD PLUS
Mitogen-NIL: 7.34 [IU]/mL
NIL: 0.02 [IU]/mL
QuantiFERON-TB Gold Plus: NEGATIVE
TB1-NIL: 0 [IU]/mL
TB2-NIL: 0 [IU]/mL

## 2024-06-30 ENCOUNTER — Other Ambulatory Visit: Payer: Self-pay

## 2024-06-30 ENCOUNTER — Other Ambulatory Visit: Payer: Self-pay | Admitting: Pharmacy Technician

## 2024-06-30 ENCOUNTER — Other Ambulatory Visit: Payer: Self-pay | Admitting: Physician Assistant

## 2024-06-30 DIAGNOSIS — M0579 Rheumatoid arthritis with rheumatoid factor of multiple sites without organ or systems involvement: Secondary | ICD-10-CM

## 2024-06-30 DIAGNOSIS — Z79899 Other long term (current) drug therapy: Secondary | ICD-10-CM

## 2024-06-30 MED ORDER — HUMIRA (2 PEN) 40 MG/0.4ML ~~LOC~~ AJKT
40.0000 mg | AUTO-INJECTOR | SUBCUTANEOUS | 2 refills | Status: DC
  Filled 2024-06-30: qty 2, 28d supply, fill #0
  Filled 2024-08-03: qty 2, 28d supply, fill #1
  Filled 2024-09-08: qty 2, 28d supply, fill #2

## 2024-06-30 NOTE — Telephone Encounter (Signed)
 Last Fill: 06/05/2024 (30 day supply)  Labs: 06/08/2024 CBC and CMP WNL   TB Gold: 06/08/2024 Neg    Next Visit: 09/20/2024  Last Visit: 03/30/2024  DX: Rheumatoid arthritis involving multiple sites with positive rheumatoid factor   Current Dose per office note 03/30/2024: [name]  40 mg sq injections every 14 days   Okay to refill [name] ?

## 2024-06-30 NOTE — Progress Notes (Signed)
 Specialty Pharmacy Refill Coordination Note  SHNEUR WHITTENBURG is a 58 y.o. male contacted today regarding refills of specialty medication(s) Adalimumab  ([name]  (2 Pen))   Patient requested Delivery   Delivery date: 07/07/24   Verified address: [address]  [place]   Medication will be filled on 07/06/24.   This fill date is pending response to refill request from provider. Patient is aware and if they have not received fill by intended date they must follow up with pharmacy.

## 2024-07-06 ENCOUNTER — Other Ambulatory Visit: Payer: Self-pay

## 2024-07-27 ENCOUNTER — Other Ambulatory Visit: Payer: Self-pay

## 2024-08-03 ENCOUNTER — Other Ambulatory Visit: Payer: Self-pay

## 2024-08-05 ENCOUNTER — Other Ambulatory Visit: Payer: Self-pay

## 2024-08-08 ENCOUNTER — Other Ambulatory Visit: Payer: Self-pay

## 2024-08-08 NOTE — Progress Notes (Signed)
 Specialty Pharmacy Refill Coordination Note  Ricky Hardin is a 58 y.o. male contacted today regarding refills of specialty medication(s) Adalimumab  ([name]  (2 Pen))   Patient requested Delivery   Delivery date: 08/19/24   Verified address: [address]  [name]   Medication will be filled on 10.09.25.

## 2024-09-06 NOTE — Progress Notes (Signed)
 Office Visit Note  Patient: Ricky Hardin             Date of Birth: Apr 09, 1966           MRN: [id number]             PCP: [facility] Referring: [facility] Visit Date: 09/20/2024 Occupation: Data Unavailable  Subjective:  Medication management  History of Present Illness: Ricky Hardin is a 58 y.o. male with seropositive rheumatoid arthritis and osteoarthritis.  He returns today after his last visit in May 2025.  He denies having a flare of rheumatoid arthritis.  He states he is very active and plays tennis on a regular basis about 4 times a week.  He notices some stiffness after playing tennis occasionally.  He has minimal morning stiffness.  He denies having any joint swelling.  He continues to be on [name]  40 mg subcu every other week and methotrexate  7 tablets p.o. weekly along with folic acid  2 mg daily.  Denies any interruption in the treatment.  He states he has been getting annual skin examination to screen for skin cancer.    Activities of Daily Living:  Patient reports morning stiffness for 10-15 minutes.   Patient Denies nocturnal pain.  Difficulty dressing/grooming: Denies Difficulty climbing stairs: Denies Difficulty getting out of chair: Denies Difficulty using hands for taps, buttons, cutlery, and/or writing: Denies  Review of Systems  Constitutional:  Negative for fatigue.  HENT:  Negative for mouth sores and mouth dryness.   Eyes:  Negative for dryness.  Respiratory:  Negative for shortness of breath.   Cardiovascular:  Negative for chest pain and palpitations.  Gastrointestinal:  Negative for blood in stool, constipation and diarrhea.  Endocrine: Negative for increased urination.  Genitourinary:  Negative for involuntary urination.  Musculoskeletal:  Positive for morning stiffness. Negative for joint pain, gait problem, joint pain, joint swelling, myalgias, muscle weakness, muscle tenderness and myalgias.  Skin:  Negative for  color change, rash, hair loss and sensitivity to sunlight.  Allergic/Immunologic: Negative for susceptible to infections.  Neurological:  Positive for headaches. Negative for dizziness.  Hematological:  Negative for swollen glands.  Psychiatric/Behavioral:  Negative for depressed mood and sleep disturbance. The patient is not nervous/anxious.     PMFS History:  Patient Active Problem List   Diagnosis Date Noted   RA (rheumatoid arthritis) (HCC) 09/19/2016   Osteoarthritis of both hands 09/19/2016   Osteoarthritis of both feet 09/19/2016    Past Medical History:  Diagnosis Date   Osteoarthritis of both feet 09/19/2016   Osteoarthritis of both hands 09/19/2016   RA (rheumatoid arthritis) (HCC) 09/19/2016   +RF, +CCP Erosive disease    Family History  Problem Relation Age of Onset   Colon polyps Mother    Heart disease Father 6   Rheum arthritis Sister    Colon cancer Neg Hx    Esophageal cancer Neg Hx    Stomach cancer Neg Hx    Rectal cancer Neg Hx    Past Surgical History:  Procedure Laterality Date   COLONOSCOPY  06/2020   Social History   Tobacco Use   Smoking status: Never    Passive exposure: Never   Smokeless tobacco: Never  Vaping Use   Vaping status: Never Used  Substance Use Topics   Alcohol use: No   Drug use: No   Social History   Social History Narrative   Not on file  There is no immunization history on file for this patient.   Objective: Vital Signs: BP (!) 147/89   Pulse 67   Temp 97.6 F (36.4 C)   Resp 16   Ht 5' 10 (1.778 m)   Wt 191 lb 3.2 oz (86.7 kg)   BMI 27.43 kg/m    Physical Exam Vitals and nursing note reviewed.  Constitutional:      Appearance: He is well-developed.  HENT:     Head: Normocephalic and atraumatic.  Eyes:     Conjunctiva/sclera: Conjunctivae normal.     Pupils: Pupils are equal, round, and reactive to light.  Cardiovascular:     Rate and Rhythm: Normal rate and regular rhythm.     Heart  sounds: Normal heart sounds.  Pulmonary:     Effort: Pulmonary effort is normal.     Breath sounds: Normal breath sounds.  Abdominal:     General: Bowel sounds are normal.     Palpations: Abdomen is soft.  Musculoskeletal:     Cervical back: Normal range of motion and neck supple.  Skin:    General: Skin is warm and dry.     Capillary Refill: Capillary refill takes less than 2 seconds.  Neurological:     Mental Status: He is alert and oriented to person, place, and time.  Psychiatric:        Behavior: Behavior normal.      Musculoskeletal Exam: Cervical, thoracic and lumbar spine were in good range of motion.  There was no SI joint tenderness.  Shoulder joints, elbow joints, wrist joints, MCPs, PIPs and DIPs were in good range of motion with no synovitis.  Hip joints and knee joints were in good range of motion without any warmth swelling or effusion.  There was no tenderness over ankles or MTPs.   CDAI Exam: CDAI Score: -- Patient Global: 10 / 100; Provider Global: 10 / 100 Swollen: --; Tender: -- Joint Exam 09/20/2024   No joint exam has been documented for this visit   There is currently no information documented on the homunculus. Go to the Rheumatology activity and complete the homunculus joint exam.  Investigation: No additional findings.  Imaging: No results found.  Recent Labs: Lab Results  Component Value Date   WBC 7.4 06/08/2024   HGB 14.4 06/08/2024   PLT 207 06/08/2024   NA 140 06/08/2024   K 4.2 06/08/2024   CL 108 06/08/2024   CO2 25 06/08/2024   GLUCOSE 97 06/08/2024   BUN 23 06/08/2024   CREATININE 0.98 06/08/2024   BILITOT 0.7 06/08/2024   ALKPHOS 83 05/05/2017   AST 20 06/08/2024   ALT 21 06/08/2024   PROT 6.7 06/08/2024   ALBUMIN 4.3 05/05/2017   CALCIUM 9.2 06/08/2024   GFRAA 95 02/12/2021   QFTBGOLDPLUS NEGATIVE 06/08/2024    Speciality Comments: [name]  10/19-01/24, [name]  01/24-  Procedures:  No procedures performed Allergies:  Patient has no known allergies.   Assessment / Plan:     Visit Diagnoses: Rheumatoid arthritis involving multiple sites with positive rheumatoid factor (HCC)-he denies having a flare of rheumatoid arthritis since last visit.  No synovitis was noted on the examination.  He does not recall when he had a last rheumatoid arthritis flare.  I discussed the option of spacing [name]  to every 3 weeks if tolerated.  Patient would like to wait until the winter is over.  High risk medication use - [name]  40 mg sq injections every 14 days, methotrexate  7 tablets by mouth  once weekly, and folic acid  2 mg daily. -On June 08, 2024 CBC and CMP were normal.  TB Gold was negative.  He was advised to get labs today and then every 3 months to monitor for drug toxicity.  Information reimmunization was placed in the AVS.  He was advised to hold [name]  and methotrexate  if he develops an infection resume after the infection resolves.  Annual skin examination to screen for skin cancer was advised.  Patient states he has been getting annual skin examination.  Use of sunscreen and sun protection was discussed.  Plan: CBC with Differential/Platelet, Comprehensive metabolic panel with GFR  Primary osteoarthritis of both hands-he has mild PIP and DIP thickening without any synovitis.  Primary osteoarthritis of both feet-he had no tenderness on the examination today.  Family history of heart disease  Orders: Orders Placed This Encounter  Procedures   CBC with Differential/Platelet   Comprehensive metabolic panel with GFR   No orders of the defined types were placed in this encounter.    Follow-Up Instructions: Return in about 5 months (around 02/18/2025) for Rheumatoid arthritis, Osteoarthritis.   Maya Nash, MD  Note - This record has been created using Animal nutritionist.  Chart creation errors have been sought, but may not always  have been located. Such creation errors do not reflect on  the standard of medical  care.

## 2024-09-08 ENCOUNTER — Other Ambulatory Visit: Payer: Self-pay

## 2024-09-08 ENCOUNTER — Other Ambulatory Visit: Payer: Self-pay | Admitting: Pharmacy Technician

## 2024-09-08 NOTE — Progress Notes (Signed)
 Specialty Pharmacy Refill Coordination Note  Ricky Hardin is a 58 y.o. male contacted today regarding refills of specialty medication(s) Adalimumab  ([name]  (2 Pen))   Patient requested Delivery   Delivery date: 09/15/24   Verified address: Patient address [address]  [place]   Medication will be filled on: 09/14/24

## 2024-09-14 ENCOUNTER — Other Ambulatory Visit: Payer: Self-pay

## 2024-09-20 ENCOUNTER — Ambulatory Visit: Attending: Rheumatology | Admitting: Rheumatology

## 2024-09-20 ENCOUNTER — Encounter: Payer: Self-pay | Admitting: Rheumatology

## 2024-09-20 VITALS — BP 156/87 | HR 66 | Temp 97.6°F | Resp 16 | Ht 70.0 in | Wt 191.2 lb

## 2024-09-20 DIAGNOSIS — M19072 Primary osteoarthritis, left ankle and foot: Secondary | ICD-10-CM

## 2024-09-20 DIAGNOSIS — M19071 Primary osteoarthritis, right ankle and foot: Secondary | ICD-10-CM | POA: Diagnosis not present

## 2024-09-20 DIAGNOSIS — Z8249 Family history of ischemic heart disease and other diseases of the circulatory system: Secondary | ICD-10-CM

## 2024-09-20 DIAGNOSIS — M19042 Primary osteoarthritis, left hand: Secondary | ICD-10-CM

## 2024-09-20 DIAGNOSIS — M19041 Primary osteoarthritis, right hand: Secondary | ICD-10-CM

## 2024-09-20 DIAGNOSIS — Z79899 Other long term (current) drug therapy: Secondary | ICD-10-CM

## 2024-09-20 DIAGNOSIS — M0579 Rheumatoid arthritis with rheumatoid factor of multiple sites without organ or systems involvement: Secondary | ICD-10-CM | POA: Diagnosis not present

## 2024-09-20 LAB — CBC WITH DIFFERENTIAL/PLATELET
Absolute Lymphocytes: 2624 {cells}/uL (ref 850–3900)
Absolute Monocytes: 753 {cells}/uL (ref 200–950)
Basophils Absolute: 57 {cells}/uL (ref 0–200)
Basophils Relative: 0.7 %
Eosinophils Absolute: 292 {cells}/uL (ref 15–500)
Eosinophils Relative: 3.6 %
HCT: 46.5 % (ref 38.5–50.0)
Hemoglobin: 15.3 g/dL (ref 13.2–17.1)
MCH: 30.2 pg (ref 27.0–33.0)
MCHC: 32.9 g/dL (ref 32.0–36.0)
MCV: 91.7 fL (ref 80.0–100.0)
MPV: 12.3 fL (ref 7.5–12.5)
Monocytes Relative: 9.3 %
Neutro Abs: 4374 {cells}/uL (ref 1500–7800)
Neutrophils Relative %: 54 %
Platelets: 248 Thousand/uL (ref 140–400)
RBC: 5.07 Million/uL (ref 4.20–5.80)
RDW: 13.1 % (ref 11.0–15.0)
Total Lymphocyte: 32.4 %
WBC: 8.1 Thousand/uL (ref 3.8–10.8)

## 2024-09-20 LAB — COMPREHENSIVE METABOLIC PANEL WITH GFR
AG Ratio: 1.4 (calc) (ref 1.0–2.5)
ALT: 21 U/L (ref 9–46)
AST: 21 U/L (ref 10–35)
Albumin: 4.3 g/dL (ref 3.6–5.1)
Alkaline phosphatase (APISO): 69 U/L (ref 35–144)
BUN: 19 mg/dL (ref 7–25)
CO2: 27 mmol/L (ref 20–32)
Calcium: 9.5 mg/dL (ref 8.6–10.3)
Chloride: 105 mmol/L (ref 98–110)
Creat: 0.91 mg/dL (ref 0.70–1.30)
Globulin: 3 g/dL (ref 1.9–3.7)
Glucose, Bld: 85 mg/dL (ref 65–99)
Potassium: 4.3 mmol/L (ref 3.5–5.3)
Sodium: 139 mmol/L (ref 135–146)
Total Bilirubin: 0.8 mg/dL (ref 0.2–1.2)
Total Protein: 7.3 g/dL (ref 6.1–8.1)
eGFR: 98 mL/min/1.73m2 (ref >=60)

## 2024-09-20 NOTE — Patient Instructions (Signed)
 Standing Labs We placed an order today for your standing lab work.   Please have your standing labs drawn in February and every 3 months  Please have your labs drawn 2 weeks prior to your appointment so that the provider can discuss your lab results at your appointment, if possible.  Please note that you may see your imaging and lab results in MyChart before we have reviewed them. We will contact you once all results are reviewed. Please allow our office up to 72 hours to thoroughly review all of the results before contacting the office for clarification of your results.  WALK-IN LAB HOURS  Monday through Thursday from 8:00 am -12:30 pm and 1:00 pm-4:30 pm and Friday from 8:00 am-12:00 pm.  Patients with office visits requiring labs will be seen before walk-in labs.  You may encounter longer than normal wait times. Please allow additional time. Wait times may be shorter on  Monday and Thursday afternoons.  We do not book appointments for walk-in labs. We appreciate your patience and understanding with our staff.   Labs are drawn by [facility]. Please bring your co-pay at the time of your lab draw.  You may receive a bill from [facility] for your lab work.  Please note if you are on Hydroxychloroquine and and an order has been placed for a Hydroxychloroquine level,  you will need to have it drawn 4 hours or more after your last dose.  If you wish to have your labs drawn at another location, please call the office 24 hours in advance so we can fax the orders.  The office is located at [address], [place], [place] [address]   If you have any questions regarding directions or hours of operation,  please call [phone].   As a reminder, please drink plenty of water prior to coming for your lab work. Thanks!   Vaccines You are taking a medication(s) that can suppress your immune system.  The following immunizations are recommended: Flu annually RSV Covid-19  Td/Tdap (tetanus,  diphtheria, pertussis) every 10 years Pneumonia (Prevnar 15 then Pneumovax 23 at least 1 year apart.  Alternatively, can take Prevnar 20 without needing additional dose) Shingrix: 2 doses from 4 weeks to 6 months apart  Please check with your PCP to make sure you are up to date.   If you have signs or symptoms of an infection or start antibiotics: First, call your PCP for workup of your infection. Hold your medication through the infection, until you complete your antibiotics, and until symptoms resolve if you take the following: Injectable medication (Actemra, Benlysta, Cimzia, Cosentyx, Enbrel, [name] , Kevzara, Orencia, Remicade, Simponi, Stelara, Taltz, Tremfya) Methotrexate  Leflunomide (Arava) Mycophenolate (Cellcept) Xeljanz, Olumiant, or Rinvoq   Please get an annual skin examination to screen for skin cancer while you are on [name] .  Please use sunscreen and sun protection.

## 2024-09-21 ENCOUNTER — Ambulatory Visit: Payer: Self-pay | Admitting: Rheumatology

## 2024-09-21 NOTE — Progress Notes (Signed)
 CBC and CMP are normal.

## 2024-09-26 DIAGNOSIS — X32XXXD Exposure to sunlight, subsequent encounter: Secondary | ICD-10-CM | POA: Diagnosis not present

## 2024-09-26 DIAGNOSIS — L570 Actinic keratosis: Secondary | ICD-10-CM | POA: Diagnosis not present

## 2024-10-05 ENCOUNTER — Other Ambulatory Visit (HOSPITAL_COMMUNITY): Payer: Self-pay

## 2024-10-05 ENCOUNTER — Other Ambulatory Visit: Payer: Self-pay

## 2024-10-05 ENCOUNTER — Other Ambulatory Visit: Payer: Self-pay | Admitting: Physician Assistant

## 2024-10-05 DIAGNOSIS — M0579 Rheumatoid arthritis with rheumatoid factor of multiple sites without organ or systems involvement: Secondary | ICD-10-CM

## 2024-10-05 DIAGNOSIS — Z79899 Other long term (current) drug therapy: Secondary | ICD-10-CM

## 2024-10-05 MED ORDER — HUMIRA (2 PEN) 40 MG/0.4ML ~~LOC~~ AJKT
40.0000 mg | AUTO-INJECTOR | SUBCUTANEOUS | 2 refills | Status: AC
  Filled 2024-10-05 – 2024-10-07 (×2): qty 2, 28d supply, fill #0
  Filled 2024-11-11 – 2024-12-12 (×5): qty 2, 28d supply, fill #1

## 2024-10-05 NOTE — Telephone Encounter (Signed)
 Last Fill: 06/30/2024  Labs: 09/20/24 CBC and CMP are normal.  TB Gold: 06/08/2024 TB Gold negative   Next Visit: 02/20/2025  Last Visit: 09/20/2024  IK:Myzlfjunpi arthritis involving multiple sites with positive rheumatoid factor (HCC)   Current Dose per office note 09/20/2024: [name]  40 mg sq injections every 14 days   Okay to refill [name] ?

## 2024-10-07 ENCOUNTER — Other Ambulatory Visit: Payer: Self-pay

## 2024-10-07 NOTE — Progress Notes (Signed)
 Specialty Pharmacy Refill Coordination Note  Ricky Hardin is a 58 y.o. male contacted today regarding refills of specialty medication(s) Adalimumab  ([name]  (2 Pen))   Patient requested Delivery   Delivery date: 10/21/24   Verified address: Patient address [address]  [place]   Medication will be filled on: 10/20/24

## 2024-10-20 ENCOUNTER — Other Ambulatory Visit: Payer: Self-pay

## 2024-10-23 ENCOUNTER — Other Ambulatory Visit: Payer: Self-pay | Admitting: Physician Assistant

## 2024-10-24 NOTE — Telephone Encounter (Signed)
 Last Fill: 03/30/2024  Labs: 09/20/2024 CBC and CMP are normal.   Next Visit: 02/20/2025  Last Visit: 09/20/2024  DX: Rheumatoid arthritis involving multiple sites with positive rheumatoid factor   Current Dose per office note 09/20/2024: methotrexate  7 tablets by mouth once weekly   Okay to refill Methotrexate ?

## 2024-10-25 ENCOUNTER — Encounter: Payer: Self-pay | Admitting: Family Medicine

## 2024-10-25 ENCOUNTER — Ambulatory Visit (INDEPENDENT_AMBULATORY_CARE_PROVIDER_SITE_OTHER): Admitting: Family Medicine

## 2024-10-25 VITALS — BP 124/70 | HR 71 | Ht 70.0 in | Wt 193.0 lb

## 2024-10-25 DIAGNOSIS — Z23 Encounter for immunization: Secondary | ICD-10-CM

## 2024-10-25 DIAGNOSIS — M0579 Rheumatoid arthritis with rheumatoid factor of multiple sites without organ or systems involvement: Secondary | ICD-10-CM

## 2024-10-25 DIAGNOSIS — Z7689 Persons encountering health services in other specified circumstances: Secondary | ICD-10-CM

## 2024-10-25 NOTE — Progress Notes (Signed)
 Patient Office Visit  Assessment & Plan:  Encounter to establish care -     Lipid panel -     TSH -     Hemoglobin A1c  Rheumatoid arthritis involving multiple sites with positive rheumatoid factor (HCC)  Needs flu shot -     Flu vaccine trivalent PF, 6mos and older(Flulaval,Afluria,Fluarix,Fluzone)  Need for pneumococcal 20-valent conjugate vaccination -     Pneumococcal conjugate vaccine 20-valent   Assessment and Plan    Rheumatoid arthritis with positive rheumatoid factor Rheumatoid arthritis managed with [name]  and methotrexate . Occasional headaches possibly methotrexate -related. Sinus congestion alleviated with decongestants. - Consider Nasonex or Flonase for recurrent sinus congestion.  General Health Maintenance Discussed vaccination importance due to immunosuppressive therapy. Addressed concerns about vaccine efficacy and side effects. Explained benefits of flu and pneumonia vaccines in reducing serious infection risk. - Administered flu shot and pneumonia vaccine. - Ordered blood work: A1c, thyroid, cholesterol.     Patient will consider the Shingrix vaccine in the future as well as a tetanus.  Patient will return for physical.  Recommend healthy diet consistent exercise. Recommend healthy diet i.e mediterranean/DASH diet, consistent exercise - 30 minutes 5 day per week, and gradual weight loss. Test results were reviewed and analyzed as part of the medical decision making of this visit.  Reviewed previous notes from rheumatology during the office visit.  Return if symptoms worsen or fail to improve.   Subjective:    Patient ID: Ricky Hardin, male    DOB: Jan 01, 1966  Age: 58 y.o. MRN: [id number]  Chief Complaint  Patient presents with   Medical Management of Chronic Issues   Establish Care    HPI Discussed the use of AI scribe software for clinical note transcription with the patient, who gave verbal consent to proceed.  History of Present Illness       History of Present Illness Ricky Hardin is a 58 year old male with rheumatoid arthritis who presents for establishment of primary care.  He has been managing rheumatoid arthritis for approximately ten years and is currently on [name]  and methotrexate . He experiences occasional headaches, which he attributes to methotrexate , but otherwise tolerates the medication well. No significant issues with osteoarthritis have been noted.  He has not historically received vaccines, expressing concerns about their effectiveness due to different variants, particularly with the flu shot.  He experienced a recent episode of sinus congestion with clear mucus and a severe headache lasting over a week, which he managed with decongestants. He inquired about the commonality of these symptoms and potential treatments.  He underwent a colonoscopy about a year ago, which was clear, and he is on a ten-year follow-up plan. He has not had a recent physical exam and is interested in blood work to check his A1c, triglycerides, and other parameters, although he has no history of prediabetes.  His family history includes heart disease in his father, who passed away at 42 after bypass surgery, and melanoma in his mother, which occurred later in her life. There is no significant family history of diabetes or other cancers.  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 He tries to eat healthily and mostly cooks at home.  Physical Exam CHEST: Lungs clear to auscultation bilaterally.  Results Labs Renal function (09/2024): Within normal limits Hepatic function (09/2024): Within normal limits  Assessment and Plan Rheumatoid arthritis with positive rheumatoid factor Rheumatoid arthritis managed with [name]  and methotrexate . Occasional  headaches possibly methotrexate -related. Sinus congestion alleviated with decongestants. - Consider Nasonex or Flonase for  recurrent sinus congestion.  General Health Maintenance Discussed vaccination importance due to immunosuppressive therapy. Addressed concerns about vaccine efficacy and side effects. Explained benefits of flu and pneumonia vaccines in reducing serious infection risk. - Administered flu shot and pneumonia vaccine. - Ordered blood work: A1c, thyroid, cholesterol.  The 10-year ASCVD risk score (Arnett DK, et al., 2019) is: 7%   Values used to calculate the score:     Age: 10 years     Clinically relevant sex: Male     Is Non-Hispanic African American: No     Diabetic: No     Tobacco smoker: No     Systolic Blood Pressure: 124 mmHg     Is BP treated: No     HDL Cholesterol: 52 mg/dL     Total Cholesterol: 190 mg/dL   The ASCVD Risk score (Arnett DK, et al., 2019) failed to calculate for the following reasons:   Cannot find a previous HDL lab   Cannot find a previous total cholesterol lab   * - Cholesterol units were assumed  Past Medical History:  Diagnosis Date   Osteoarthritis of both feet 09/19/2016   Osteoarthritis of both hands 09/19/2016   RA (rheumatoid arthritis) (HCC) 09/19/2016   +RF, +CCP Erosive disease   Past Surgical History:  Procedure Laterality Date   COLONOSCOPY  06/2020   Social History[1] Family History  Problem Relation Age of Onset   Colon polyps Mother    Melanoma Mother    Heart disease Father 33   Rheum arthritis Sister    Colon cancer Neg Hx    Esophageal cancer Neg Hx    Stomach cancer Neg Hx    Rectal cancer Neg Hx    Allergies[2]  ROS    Objective:    BP 124/70   Pulse 71   Ht 5' 10 (1.778 m)   Wt 193 lb (87.5 kg)   SpO2 98%   BMI 27.69 kg/m  BP Readings from Last 3 Encounters:  10/25/24 124/70  09/20/24 (!) 156/87  03/30/24 130/83   Wt Readings from Last 3 Encounters:  10/25/24 193 lb (87.5 kg)  09/20/24 191 lb 3.2 oz (86.7 kg)  03/30/24 190 lb 6.4 oz (86.4 kg)    Physical Exam Vitals and nursing note reviewed.   Constitutional:      General: He is not in acute distress.    Appearance: Normal appearance.  HENT:     Head: Normocephalic.     Right Ear: Tympanic membrane, ear canal and external ear normal.     Left Ear: Tympanic membrane, ear canal and external ear normal.  Eyes:     Extraocular Movements: Extraocular movements intact.     Conjunctiva/sclera: Conjunctivae normal.     Pupils: Pupils are equal, round, and reactive to light.  Cardiovascular:     Rate and Rhythm: Normal rate and regular rhythm.     Heart sounds: Normal heart sounds.  Pulmonary:     Effort: Pulmonary effort is normal.     Breath sounds: Normal breath sounds.  Musculoskeletal:     Right lower leg: No edema.     Left lower leg: No edema.  Neurological:     General: No focal deficit present.     Mental Status: He is alert and oriented to person, place, and time.  Psychiatric:        Mood and Affect: Mood normal.  Behavior: Behavior normal.        Thought Content: Thought content normal.        Judgment: Judgment normal.      No results found for any visits on 10/25/24.          [1]  Social History Tobacco Use   Smoking status: Never    Passive exposure: Never   Smokeless tobacco: Never  Vaping Use   Vaping status: Never Used  Substance Use Topics   Alcohol use: Not Currently   Drug use: Never  [2] No Known Allergies

## 2024-10-26 ENCOUNTER — Ambulatory Visit: Payer: Self-pay | Admitting: Family Medicine

## 2024-10-26 LAB — LIPID PANEL
Cholesterol: 190 mg/dL (ref <200)
HDL: 52 mg/dL (ref >=40)
LDL Cholesterol (Calc): 119 mg/dL — ABNORMAL HIGH
Non-HDL Cholesterol (Calc): 138 mg/dL — ABNORMAL HIGH (ref <130)
Total CHOL/HDL Ratio: 3.7 (calc) (ref <5.0)
Triglycerides: 91 mg/dL (ref <150)

## 2024-10-26 LAB — HEMOGLOBIN A1C
Hgb A1c MFr Bld: 5.3 % (ref <5.7)
Mean Plasma Glucose: 105 mg/dL
eAG (mmol/L): 5.8 mmol/L

## 2024-10-26 LAB — TSH: TSH: 3.68 m[IU]/L (ref 0.40–4.50)

## 2024-11-03 ENCOUNTER — Other Ambulatory Visit: Payer: Self-pay | Admitting: Rheumatology

## 2024-11-09 ENCOUNTER — Other Ambulatory Visit (HOSPITAL_COMMUNITY): Payer: Self-pay

## 2024-11-11 ENCOUNTER — Other Ambulatory Visit (HOSPITAL_COMMUNITY): Payer: Self-pay

## 2024-11-15 ENCOUNTER — Other Ambulatory Visit: Payer: Self-pay

## 2024-11-16 ENCOUNTER — Other Ambulatory Visit: Payer: Self-pay

## 2024-11-17 ENCOUNTER — Other Ambulatory Visit (HOSPITAL_COMMUNITY): Payer: Self-pay

## 2024-11-18 ENCOUNTER — Other Ambulatory Visit (HOSPITAL_COMMUNITY): Payer: Self-pay

## 2024-11-21 ENCOUNTER — Other Ambulatory Visit: Payer: Self-pay

## 2024-11-23 ENCOUNTER — Ambulatory Visit: Payer: Self-pay

## 2024-11-23 ENCOUNTER — Other Ambulatory Visit: Payer: Self-pay

## 2024-11-23 ENCOUNTER — Ambulatory Visit: Admitting: Family Medicine

## 2024-11-23 ENCOUNTER — Encounter: Payer: Self-pay | Admitting: Family Medicine

## 2024-11-23 VITALS — BP 132/82 | HR 74 | Ht 70.0 in | Wt 193.0 lb

## 2024-11-23 DIAGNOSIS — W540XXA Bitten by dog, initial encounter: Secondary | ICD-10-CM

## 2024-11-23 DIAGNOSIS — L03011 Cellulitis of right finger: Secondary | ICD-10-CM | POA: Diagnosis not present

## 2024-11-23 DIAGNOSIS — M0579 Rheumatoid arthritis with rheumatoid factor of multiple sites without organ or systems involvement: Secondary | ICD-10-CM | POA: Diagnosis not present

## 2024-11-23 DIAGNOSIS — Z23 Encounter for immunization: Secondary | ICD-10-CM

## 2024-11-23 MED ORDER — AMOXICILLIN-POT CLAVULANATE 875-125 MG PO TABS: 1.0000 | ORAL_TABLET | Freq: Two times a day (BID) | ORAL | 0 refills | Status: AC

## 2024-11-23 MED ORDER — PREDNISONE 10 MG (21) PO TBPK: ORAL_TABLET | ORAL | 0 refills | Status: AC

## 2024-11-23 NOTE — Telephone Encounter (Signed)
 FYI Only or Action Required?: FYI only for provider: appointment scheduled on 1/14.  Patient was last seen in primary care on 10/25/2024 by Aletha Bene, MD.  Called Nurse Triage reporting Hand Injury and Animal Bite.  Symptoms began several days ago.  Interventions attempted: Other: hydrogen peroxide.  Symptoms are: gradually worsening.  Triage Disposition: See HCP Within 4 Hours (Or PCP Triage)  Patient/caregiver understands and will follow disposition?: Yes  Copied from CRM [id number]. Topic: Clinical - Red Word Triage >> Nov 23, 2024  8:10 AM Mesmerise C wrote: [facility] that prompted transfer to Nurse Triage: Patient has an injury on his right hand since Sunday states there's swelling along with some pain wants to check to makes sure not infected Reason for Disposition  Looks infected (spreading redness, red streak, pus)  Answer Assessment - Initial Assessment Questions 1. APPEARANCE What does it look like?  (e.g., abrasion, bruise, puncture)      Bled at time of injury but now right index finger is swollen and warm to the touch  3. LOCATION: Where is the bite located?      R hand index finger, edge of nail  4. ONSET: When did the bite happen? (e.g., minutes, hours ago)      Sunday  5. ANIMAL: What type of animal caused the bite? Is the injury from a bite or a claw? If the animal is a dog or a cat, ask: Was it a pet or a stray? Was it acting ill or behaving strangely?     Dog UTD on vaccines, maltese mix  6. RABIES VACCINE: For dog or cat bites, ask: Do you know if the pet is vaccinated against rabies?  (e.g., yes, no, overdue for rabies shot, unknown)     Yes  7. CIRCUMSTANCES: Tell me how this happened.      Dog got excited and tried to bite the shop vac but bit his hand  8. TETANUS: When was your last tetanus booster?     Pt unsure  Protocols used: Animal Bite-A-AH

## 2024-11-23 NOTE — Progress Notes (Signed)
 "  Patient Office Visit  Assessment & Plan:  Dog bite, initial encounter -     Amoxicillin -Pot Clavulanate; Take 1 tablet by mouth 2 (two) times daily for 14 days.  Dispense: 28 tablet; Refill: 0 -     predniSONE ; Use as directed.  Dispense: 21 each; Refill: 0 -     Tdap vaccine greater than or equal to 60yo IM  Cellulitis of finger of right hand -     Amoxicillin -Pot Clavulanate; Take 1 tablet by mouth 2 (two) times daily for 14 days.  Dispense: 28 tablet; Refill: 0 -     predniSONE ; Use as directed.  Dispense: 21 each; Refill: 0  Rheumatoid arthritis involving multiple sites with positive rheumatoid factor (HCC)   Assessment and Plan          Patient will keep us  posted re his finger. If worsening symptoms may need to see hand specialist. Patient will send message via mychart.  Tetanus updated today.  Return if symptoms worsen or fail to improve.   Subjective:    Patient ID: Ricky Hardin, male    DOB: 06/21/1966  Age: 59 y.o. MRN: [id number]  Chief Complaint  Patient presents with   Animal Bite    No record of tetanus. Pt was bit by his dog on Sunday and reports a small amount of bleeding and swelling of the R index finger.     Animal Bite    History of Present Illness         Patient with a past medical history for well controlled rheumatoid arthritis and osteoarthritis having sustained a dog bite over right index finger Was bit by his dog this past Sunday over her right index finger.  It is now swollen and warm to the touch. Patient cleaned finger well.  Not having fever or chills difficult time bending the finger due to the swelling.  Not taking any over-the-counter medications for this.  Patient does not recall when he had last Tetanus shot. Patient is not allergic to any antibiotics.  Dog is up-to-date on all rabies shots.  Patient has taking prednisone  in the past.    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   RA (rheumatoid arthritis) (HCC) 09/19/2016   +RF, +CCP Erosive disease   Past Surgical History:  Procedure Laterality Date   COLONOSCOPY  06/2020   Social History[1] Family History  Problem Relation Age of Onset   Colon polyps Mother    Melanoma Mother    Heart disease Father 34   Rheum arthritis Sister    Colon cancer Neg Hx    Esophageal cancer Neg Hx    Stomach cancer Neg Hx    Rectal cancer Neg Hx    Allergies[2]  ROS    Objective:    BP 132/82   Pulse 74   Ht 5' 10 (1.778 m)   Wt 193 lb (87.5 kg)   SpO2 98%   BMI 27.69 kg/m  BP Readings from Last 3 Encounters:  11/23/24 132/82  10/25/24 124/70  09/20/24 (!) 156/87   Wt Readings from Last 3 Encounters:  11/23/24 193 lb (87.5 kg)  10/25/24 193 lb (87.5 kg)  09/20/24 191 lb 3.2 oz (86.7 kg)    Physical Exam Vitals and nursing note reviewed.  Constitutional:      General: He is not  in acute distress. Musculoskeletal:     Right hand: Normal.     Comments: Right index finger has tiny puncture over nail bed. Entire index finger is swollen and warm to the touch.  No redness or streaking noted. No purulence. patient has difficulty bending finger or making a fist due to swelling.   Skin:    Findings: No erythema.      No results found for any visits on 11/23/24.          [1]  Social History Tobacco Use   Smoking status: Never    Passive exposure: Never   Smokeless tobacco: Never  Vaping Use   Vaping status: Never Used  Substance Use Topics   Alcohol use: Not Currently   Drug use: Never  [2] No Known Allergies  "

## 2024-11-28 ENCOUNTER — Other Ambulatory Visit: Payer: Self-pay

## 2024-11-30 ENCOUNTER — Other Ambulatory Visit: Payer: Self-pay

## 2024-12-02 ENCOUNTER — Other Ambulatory Visit: Payer: Self-pay

## 2024-12-05 ENCOUNTER — Other Ambulatory Visit: Payer: Self-pay

## 2024-12-05 NOTE — Progress Notes (Signed)
 Pausing enrollment while options are being explored.

## 2024-12-12 ENCOUNTER — Other Ambulatory Visit (HOSPITAL_COMMUNITY): Payer: Self-pay

## 2024-12-12 ENCOUNTER — Other Ambulatory Visit: Payer: Self-pay

## 2024-12-12 NOTE — Progress Notes (Signed)
 Specialty Pharmacy Refill Coordination Note  Ricky Hardin is a 59 y.o. male contacted today regarding refills of specialty medication(s) Adalimumab  ([name]  (2 Pen))   Patient requested Delivery   Delivery date: 12/22/24   Verified address: Patient address [address]  [place]   Medication will be filled on: 12/21/24  Patient is calling the coupon card to see about coverage, he will call us  back.

## 2024-12-12 NOTE — Progress Notes (Signed)
 Specialty Pharmacy Ongoing Clinical Assessment Note  Ricky Hardin is a 59 y.o. male who is being followed by the specialty pharmacy service for RxSp Rheumatoid Arthritis   Patient's specialty medication(s) reviewed today: Adalimumab  ([name]  (2 Pen))   Missed doses in the last 4 weeks: 1 (sickness)   Patient/Caregiver did not have any additional questions or concerns.   Therapeutic benefit summary: Patient is achieving benefit   Adverse events/side effects summary: No adverse events/side effects   Patient's therapy is appropriate to: Continue    Goals Addressed             This Visit's Progress    Reduce signs and symptoms   On track    Patient is on track. Patient will maintain adherence         Follow up: 12 months  Silvano LOISE Dolly Specialty Pharmacist

## 2025-02-20 ENCOUNTER — Ambulatory Visit: Admitting: Physician Assistant
